# Patient Record
Sex: Male | Born: 1990 | Hispanic: Yes | Marital: Single | Smoking: Never smoker
Health system: Southern US, Community
[De-identification: ages and names within clinical notes are randomized; demographics above are authoritative.]

## PROBLEM LIST (undated history)

## (undated) DIAGNOSIS — K219 Gastro-esophageal reflux disease without esophagitis: Secondary | ICD-10-CM

## (undated) DIAGNOSIS — Z9111 Patient's noncompliance with dietary regimen: Secondary | ICD-10-CM

## (undated) DIAGNOSIS — R112 Nausea with vomiting, unspecified: Secondary | ICD-10-CM

## (undated) DIAGNOSIS — F1290 Cannabis use, unspecified, uncomplicated: Secondary | ICD-10-CM

## (undated) DIAGNOSIS — K279 Peptic ulcer, site unspecified, unspecified as acute or chronic, without hemorrhage or perforation: Secondary | ICD-10-CM

## (undated) DIAGNOSIS — R1115 Cyclical vomiting syndrome unrelated to migraine: Secondary | ICD-10-CM

## (undated) DIAGNOSIS — K3184 Gastroparesis: Secondary | ICD-10-CM

## (undated) DIAGNOSIS — G894 Chronic pain syndrome: Secondary | ICD-10-CM

## (undated) DIAGNOSIS — F12988 Cannabis use, unspecified with other cannabis-induced disorder: Secondary | ICD-10-CM

## (undated) DIAGNOSIS — Z91199 Patient's noncompliance with other medical treatment and regimen due to unspecified reason: Secondary | ICD-10-CM

## (undated) DIAGNOSIS — K449 Diaphragmatic hernia without obstruction or gangrene: Secondary | ICD-10-CM

## (undated) DIAGNOSIS — F419 Anxiety disorder, unspecified: Secondary | ICD-10-CM

## (undated) DIAGNOSIS — Z765 Malingerer [conscious simulation]: Secondary | ICD-10-CM

## (undated) HISTORY — PX: APPENDECTOMY: SHX54

---
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
## 2014-12-04 ENCOUNTER — Emergency Department (HOSPITAL_BASED_OUTPATIENT_CLINIC_OR_DEPARTMENT_OTHER)
Admission: EM | Admit: 2014-12-04 | Discharge: 2014-12-04 | Disposition: A | Discharge status: Home / Self Care | Payer: Self-pay | Attending: Emergency Medicine | Admitting: Emergency Medicine

## 2014-12-04 ENCOUNTER — Encounter (HOSPITAL_BASED_OUTPATIENT_CLINIC_OR_DEPARTMENT_OTHER): Payer: Self-pay

## 2014-12-04 DIAGNOSIS — Z87891 Personal history of nicotine dependence: Secondary | ICD-10-CM | POA: Insufficient documentation

## 2014-12-04 DIAGNOSIS — Z8719 Personal history of other diseases of the digestive system: Secondary | ICD-10-CM | POA: Insufficient documentation

## 2014-12-04 DIAGNOSIS — R1013 Epigastric pain: Secondary | ICD-10-CM | POA: Insufficient documentation

## 2014-12-04 DIAGNOSIS — R112 Nausea with vomiting, unspecified: Secondary | ICD-10-CM | POA: Insufficient documentation

## 2014-12-04 DIAGNOSIS — Z9049 Acquired absence of other specified parts of digestive tract: Secondary | ICD-10-CM | POA: Insufficient documentation

## 2014-12-04 HISTORY — DX: Gastroparesis: K31.84

## 2014-12-04 LAB — COMPREHENSIVE METABOLIC PANEL
ALK PHOS: 101 U/L (ref 38–126)
ALT: 25 U/L (ref 17–63)
AST: 21 U/L (ref 15–41)
Albumin: 4.7 g/dL (ref 3.5–5.0)
Anion gap: 10 (ref 5–15)
BUN: 12 mg/dL (ref 6–20)
CHLORIDE: 99 mmol/L — AB (ref 101–111)
CO2: 26 mmol/L (ref 22–32)
CREATININE: 0.86 mg/dL (ref 0.61–1.24)
Calcium: 9.2 mg/dL (ref 8.9–10.3)
GFR calc Af Amer: >60 mL/min (ref >60)
GFR calc non Af Amer: >60 mL/min (ref >60)
Glucose, Bld: 104 mg/dL — ABNORMAL HIGH (ref 65–99)
POTASSIUM: 4.1 mmol/L (ref 3.5–5.1)
Sodium: 135 mmol/L (ref 135–145)
TOTAL PROTEIN: 7.7 g/dL (ref 6.5–8.1)
Total Bilirubin: 1 mg/dL (ref 0.3–1.2)

## 2014-12-04 LAB — CBC WITH DIFFERENTIAL/PLATELET
BASOS ABS: 0 10*3/uL (ref 0.0–0.1)
Basophils Relative: 0 % (ref 0–1)
EOS PCT: 0 % (ref 0–5)
Eosinophils Absolute: 0 10*3/uL (ref 0.0–0.7)
HCT: 48.2 % (ref 39.0–52.0)
HEMOGLOBIN: 16.7 g/dL (ref 13.0–17.0)
LYMPHS ABS: 2 10*3/uL (ref 0.7–4.0)
Lymphocytes Relative: 18 % (ref 12–46)
MCH: 30.1 pg (ref 26.0–34.0)
MCHC: 34.6 g/dL (ref 30.0–36.0)
MCV: 86.8 fL (ref 78.0–100.0)
MONO ABS: 0.6 10*3/uL (ref 0.1–1.0)
MONOS PCT: 6 % (ref 3–12)
Neutro Abs: 8.4 10*3/uL — ABNORMAL HIGH (ref 1.7–7.7)
Neutrophils Relative %: 76 % (ref 43–77)
PLATELETS: 172 10*3/uL (ref 150–400)
RBC: 5.55 MIL/uL (ref 4.22–5.81)
RDW: 12.9 % (ref 11.5–15.5)
WBC: 11.1 10*3/uL — ABNORMAL HIGH (ref 4.0–10.5)

## 2014-12-04 LAB — URINE MICROSCOPIC-ADD ON

## 2014-12-04 LAB — URINALYSIS, ROUTINE W REFLEX MICROSCOPIC
Bilirubin Urine: NEGATIVE
Glucose, UA: NEGATIVE mg/dL
Hgb urine dipstick: NEGATIVE
KETONES UR: 15 mg/dL — AB
LEUKOCYTES UA: NEGATIVE
Nitrite: NEGATIVE
Protein, ur: NEGATIVE mg/dL
Specific Gravity, Urine: 1.019 (ref 1.005–1.030)
UROBILINOGEN UA: 1 mg/dL (ref 0.0–1.0)
pH: 7.5 (ref 5.0–8.0)

## 2014-12-04 LAB — LIPASE, BLOOD: Lipase: 39 U/L (ref 22–51)

## 2014-12-04 MED ORDER — ONDANSETRON HCL 4 MG PO TABS: 4.0000 mg | ORAL_TABLET | Freq: Three times a day (TID) | ORAL | Status: DC | PRN

## 2014-12-04 MED ORDER — CYCLOBENZAPRINE HCL 10 MG PO TABS: 10.0000 mg | ORAL_TABLET | Freq: Two times a day (BID) | ORAL | Status: DC | PRN

## 2014-12-04 MED ORDER — OXYCODONE-ACETAMINOPHEN 5-325 MG PO TABS
1.0000 | ORAL_TABLET | Freq: Once | ORAL | Status: AC
  Administered 2014-12-04: 1 via ORAL
  Filled 2014-12-04: qty 1

## 2014-12-04 MED ORDER — METOCLOPRAMIDE HCL 10 MG PO TABS
10.0000 mg | ORAL_TABLET | Freq: Four times a day (QID) | ORAL | Status: DC
Start: 2014-12-04 — End: 2017-01-10

## 2014-12-04 MED ORDER — ONDANSETRON 4 MG PO TBDP
4.0000 mg | ORAL_TABLET | Freq: Once | ORAL | Status: DC
  Filled 2014-12-04: qty 1

## 2014-12-04 NOTE — ED Notes (Signed)
Pt reports hx of gastroparesis and states that he "usually gets the same meds", reports diffuse abd pain and vomiting.  States gets phenergan, dilaudid and zofran.

## 2014-12-04 NOTE — Discharge Instructions (Signed)
Please follow with your gastroenterologist as soon as possible, do not hesitate to return to the emergency room for any new, worsening or concerning symptoms. Cyclic Vomiting Syndrome Cyclic vomiting syndrome is a benign condition in which patients experience bouts or cycles of severe nausea and vomiting that last for hours or even days. The bouts of nausea and vomiting alternate with longer periods of no symptoms and generally good health. Cyclic vomiting syndrome occurs mostly in children, but can affect adults. CAUSES  [facility] has no known cause. Each episode is typically similar to the previous ones. The episodes tend to:   Start at about the same time of day.  Last the same length of time.  Present the same symptoms at the same level of intensity. Cyclic vomiting syndrome can begin at any age in children and adults. Cyclic vomiting syndrome usually starts between the ages of 3 and 7 years. In adults, episodes tend to occur less often than they do in children, but they last longer. Furthermore, the events or situations that trigger episodes in adults cannot always be pinpointed as easily as they can in children. There are 4 phases of cyclic vomiting syndrome: 1. Prodrome. The prodrome phase signals that an episode of nausea and vomiting is about to begin. This phase can last from just a few minutes to several hours. This phase is often marked by belly (abdominal) pain. Sometimes taking medicine early in the prodrome phase can stop an episode in progress. However, sometimes there is no warning. A person may simply wake up in the middle of the night or early morning and begin vomiting. 2. Episode. The episode phase consists of:  Severe vomiting.  Nausea.  Gagging (retching). 3. Recovery. The recovery phase begins when the nausea and vomiting stop. Healthy color, appetite, and energy return. 4. Symptom-free interval. The symptom-free interval phase is the period between episodes when no symptoms  are present. TRIGGERS Episodes can be triggered by an infection or event. Examples of triggers include:  Infections.  Colds, allergies, sinus problems, and the flu.  Eating certain foods such as chocolate or cheese.  Foods with monosodium glutamate (MSG) or preservatives.  Fast foods.  Pre-packaged foods.  Foods with low nutritional value (junk foods).  Overeating.  Eating just before going to bed.  Hot weather.  Dehydration.  Not enough sleep or poor sleep quality.  Physical exhaustion.  Menstruation.  Motion sickness.  Emotional stress (school or home difficulties).  Excitement or stress. SYMPTOMS  The main symptoms of cyclic vomiting syndrome are:  Severe vomiting.  Nausea.  Gagging (retching). Episodes usually begin at night or the first thing in the morning. Episodes may include vomiting or retching up to 5 or 6 times an hour during the worst of the episode. Episodes usually last anywhere from 1 to 4 days. Episodes can last for up to 10 days. Other symptoms include:  Paleness.  Exhaustion.  Listlessness.  Abdominal pain.  Loose stools or diarrhea. Sometimes the nausea and vomiting are so severe that a person appears to be almost unconscious. Sensitivity to light, headache, fever, dizziness, may also accompany an episode. In addition, the vomiting may cause drooling and excessive thirst. Drinking water usually leads to more vomiting, though the water can dilute the acid in the vomit, making the episode a little less painful. Continuous vomiting can lead to dehydration, which means that the body has lost excessive water and salts. DIAGNOSIS  Cyclic vomiting syndrome is hard to diagnose because there are no clear  tests to identify it. A caregiver must diagnose cyclic vomiting syndrome by looking at symptoms and medical history. A caregiver must exclude more common diseases or disorders that can also cause nausea and vomiting. Also, diagnosis takes time  because caregivers need to identify a pattern or cycle to the vomiting. TREATMENT  Cyclic vomiting syndrome cannot be cured. Treatment varies, but people with cyclic vomiting syndrome should get plenty of rest and sleep and take medications that prevent, stop, or lessen the vomiting episodes and other symptoms. People whose episodes are frequent and long-lasting may be treated during the symptom-free intervals in an effort to prevent or ease future episodes. The symptom-free phase is a good time to eliminate anything known to trigger an episode. For example, if episodes are brought on by stress or excitement, this period is the time to find ways to reduce stress and stay calm. If sinus problems or allergies cause episodes, those conditions should be treated. The triggers listed above should be avoided or prevented. Because of the similarities between migraine and cyclic vomiting syndrome, caregivers treat some people with severe cyclic vomiting syndrome with drugs that are also used for migraine headaches. The drugs are designed to:  Prevent episodes.  Reduce their frequency.  Lessen their severity. HOME CARE INSTRUCTIONS Once a vomiting episode begins, treatment is supportive. It helps to stay in bed and sleep in a dark, quiet room. Severe nausea and vomiting may require hospitalization and intravenous (IV) fluids to prevent dehydration. Relaxing medications (sedatives) may help if the nausea continues. Sometimes, during the prodrome phase, it is possible to stop an episode from happening altogether. Only take over-the-counter or prescription medicines for pain, discomfort or fever as directed by your caregiver. Do not give aspirin to children. During the recovery phase, drinking water and replacing lost electrolytes (salts in the blood) are very important. Electrolytes are salts that the body needs to function well and stay healthy. Symptoms during the recovery phase can vary. Some people find that  their appetites return to normal immediately, while others need to begin by drinking clear liquids and then move slowly to solid food. RELATED COMPLICATIONS The severe vomiting that defines cyclic vomiting syndrome is a risk factor for several complications:  Dehydration--Vomiting causes the body to lose water quickly.  Electrolyte imbalance--Vomiting also causes the body to lose the important salts it needs to keep working properly.  Peptic esophagitis--The tube that connects the mouth to the stomach (esophagus) becomes injured from the stomach acid that comes up with the vomit.  Hematemesis--The esophagus becomes irritated and bleeds, so blood mixes with the vomit.  Mallory-Weiss tear--The lower end of the esophagus may tear open or the stomach may bruise from vomiting or retching.  Tooth decay--The acid in the vomit can hurt the teeth by corroding the tooth enamel. SEEK MEDICAL CARE IF: You have questions or problems. Document Released: 09/10/2001 Document Revised: 09/24/2011 Document Reviewed: 10/09/2010 [facility] Patient Information 2015 [place], [place]. This information is not intended to replace advice given to you by your health care provider. Make sure you discuss any questions you have with your health care provider.

## 2014-12-04 NOTE — ED Provider Notes (Signed)
CSN: [id number]     Arrival date & time 12/04/14  1344 History   First MD Initiated Contact with Patient 12/04/14 1506     Chief Complaint  Patient presents with  . Emesis     (Consider location/radiation/quality/duration/timing/severity/associated sxs/prior Treatment) HPI   Tom Munoz is a 24 y.o. male complaining of ear epigastric abdominal pain associated with 3 episodes of nonbloody, nonbilious, no coffee-ground emesis starting this morning. Patient says he has a history of gastroparesis (nondiabetic) states he was advised by his gastroenterologist that he needed Dilaudid, Phenergan and Zofran. Patient states that [facility] and was frustrated, left because they would not give him the medication that he had requested. States nausea has improved.   Past Medical History  Diagnosis Date  . Gastroparesis    Past Surgical History  Procedure Laterality Date  . Appendectomy     No family history on file. History  Substance Use Topics  . Smoking status: Former Games developermoker  . Smokeless tobacco: Not on file  . Alcohol Use: No    Review of Systems  10 systems reviewed and found to be negative, except as noted in the HPI.   Allergies  Review of patient's allergies indicates no known allergies.  Home Medications   Prior to Admission medications   Medication Sig Start Date End Date Taking? Authorizing Provider  LORazepam (ATIVAN) 0.5 MG tablet Take 0.5 mg by mouth every 8 (eight) hours.   Yes Historical Provider, MD  ondansetron (ZOFRAN-ODT) 4 MG disintegrating tablet Take 4 mg by mouth every 8 (eight) hours as needed for nausea or vomiting.   Yes Historical Provider, MD   BP 174/97 mmHg  Pulse 102  Temp(Src) 97.8 F (36.6 C) (Oral)  Resp 18  Ht 5\' 7"  (1.702 m)  Wt 175 lb (79.379 kg)  BMI 27.40 kg/m2  SpO2 100% Physical Exam  Constitutional: He is oriented to person, place, and time. He appears well-developed and well-nourished. No distress.   HENT:  Head: Normocephalic and atraumatic.  Mouth/Throat: Oropharynx is clear and moist.  Eyes: Conjunctivae and EOM are normal. Pupils are equal, round, and reactive to light.  Neck: Normal range of motion.  Cardiovascular: Normal rate, regular rhythm and intact distal pulses.   Pulmonary/Chest: Effort normal and breath sounds normal.  Abdominal: Soft. There is no tenderness.  Mild, diffuse tenderness to palpation with no guarding or rebound.  Murphy sign negative, no tenderness to palpation over McBurney's point, Rovsings, Psoas and obturator all negative.    Musculoskeletal: Normal range of motion.  Neurological: He is alert and oriented to person, place, and time.  Skin: He is not diaphoretic.  Psychiatric: He has a normal mood and affect.  Nursing note and vitals reviewed.   ED Course  Procedures (including critical care time) Labs Review Labs Reviewed  URINALYSIS, ROUTINE W REFLEX MICROSCOPIC - Abnormal; Notable for the following:    APPearance TURBID (*)    Ketones, ur 15 (*)    All other components within normal limits  URINE MICROSCOPIC-ADD ON - Abnormal; Notable for the following:    Bacteria, UA MANY (*)    All other components within normal limits  CBC WITH DIFFERENTIAL/PLATELET - Abnormal; Notable for the following:    WBC 11.1 (*)    Neutro Abs 8.4 (*)    All other components within normal limits  COMPREHENSIVE METABOLIC PANEL - Abnormal; Notable for the following:    Chloride 99 (*)    Glucose, Bld 104 (*)  All other components within normal limits  LIPASE, BLOOD    Imaging Review No results found.   EKG Interpretation None      MDM   Final diagnoses:  Non-intractable vomiting with nausea, vomiting of unspecified type    Filed Vitals:   12/04/14 1405 12/04/14 1608 12/04/14 1719  BP: 174/97 158/87 152/78  Pulse: 102 89 78  Temp: 97.8 F (36.6 C)    TempSrc: Oral    Resp: Height:  (1.702 m)    Weight: 175 lb (79.379 kg)     SpO2: 100% 100%     Medications  ondansetron (ZOFRAN-ODT) disintegrating tablet 4 mg (4 mg Oral Not Given 12/04/14 1629)  oxyCODONE-acetaminophen (PERCOCET/ROXICET) 5-325 MG per tablet 1 tablet (1 tablet Oral Given 12/04/14 1628)    Tom Munoz is a pleasant 24 y.o. male presenting with several episodes of emesis this morning associated with severe epigastric abdominal pain. States this is consistent with prior episodes of gastroparesis. Blood work with no significant abnormality, UA with 15 ketones. Abdominal exam is benign and patient states his nausea has resolved and he is tolerating by mouth, he is specifically requesting Dilaudid. I explained to him that there is no indication for Dilaudid as he is tolerating by mouth. Patient is displeased with this, I will give him Zofran and by mouth challenge with Percocet. Patient is tolerated the Percocet without issue, he has declined the Zofran. Patient is stated to the nurse that he could buy whatever narcotics he wants off the street and that he wants Dilaudid. Again I have declined the Dilaudid he will have to follow-up with his gastroenterologist. Real abdominal exams remain benign.  Evaluation does not show pathology that would require ongoing emergent intervention or inpatient treatment. Pt is hemodynamically stable and mentating appropriately. Discussed findings and plan with patient/guardian, who agrees with care plan. All questions answered. Return precautions discussed and outpatient follow up given.   Discharge Medication List as of 12/04/2014  5:04 PM    START taking these medications   Details  metoCLOPramide (REGLAN) 10 MG tablet Take 1 tablet (10 mg total) by mouth 4 (four) times daily., Starting 12/04/2014, Until Discontinued, Print             Wynetta Emery, PA-C 12/04/14 1737  Arby Barrette, MD 12/07/14 1436

## 2014-12-04 NOTE — ED Notes (Signed)
Patient refused zofran and stated that he had that medicine at home. Requested dilaudid and stated that other facilities would give him narcotics. He also stated that he could buy narcotics off the street if needed & that he would take the oxycodone but wanted something stronger.

## 2015-08-11 ENCOUNTER — Encounter (HOSPITAL_COMMUNITY): Payer: Self-pay | Admitting: Emergency Medicine

## 2015-08-11 ENCOUNTER — Emergency Department (HOSPITAL_COMMUNITY)
Admission: EM | Admit: 2015-08-11 | Discharge: 2015-08-12 | Discharge status: Against Medical Advice | Payer: Self-pay | Attending: Emergency Medicine | Admitting: Emergency Medicine

## 2015-08-11 DIAGNOSIS — Y998 Other external cause status: Secondary | ICD-10-CM | POA: Insufficient documentation

## 2015-08-11 DIAGNOSIS — S3992XA Unspecified injury of lower back, initial encounter: Secondary | ICD-10-CM | POA: Insufficient documentation

## 2015-08-11 DIAGNOSIS — Y92410 Unspecified street and highway as the place of occurrence of the external cause: Secondary | ICD-10-CM | POA: Insufficient documentation

## 2015-08-11 DIAGNOSIS — Y9389 Activity, other specified: Secondary | ICD-10-CM | POA: Insufficient documentation

## 2015-08-11 LAB — CBC WITH DIFFERENTIAL/PLATELET
BASOS ABS: 0 10*3/uL (ref 0.0–0.1)
BASOS PCT: 0 %
EOS PCT: 0 %
Eosinophils Absolute: 0 10*3/uL (ref 0.0–0.7)
HCT: 50.4 % (ref 39.0–52.0)
Hemoglobin: 17.5 g/dL — ABNORMAL HIGH (ref 13.0–17.0)
LYMPHS PCT: 15 %
Lymphs Abs: 1.6 10*3/uL (ref 0.7–4.0)
MCH: 30.9 pg (ref 26.0–34.0)
MCHC: 34.7 g/dL (ref 30.0–36.0)
MCV: 89 fL (ref 78.0–100.0)
Monocytes Absolute: 0.5 10*3/uL (ref 0.1–1.0)
Monocytes Relative: 5 %
Neutro Abs: 8.3 10*3/uL — ABNORMAL HIGH (ref 1.7–7.7)
Neutrophils Relative %: 80 %
PLATELETS: 216 10*3/uL (ref 150–400)
RBC: 5.66 MIL/uL (ref 4.22–5.81)
RDW: 13.7 % (ref 11.5–15.5)
WBC: 10.4 10*3/uL (ref 4.0–10.5)

## 2015-08-11 LAB — COMPREHENSIVE METABOLIC PANEL
ALBUMIN: 4.7 g/dL (ref 3.5–5.0)
ALT: 18 U/L (ref 17–63)
AST: 24 U/L (ref 15–41)
Alkaline Phosphatase: 91 U/L (ref 38–126)
Anion gap: 14 (ref 5–15)
BUN: 13 mg/dL (ref 6–20)
CO2: 24 mmol/L (ref 22–32)
CREATININE: 1.06 mg/dL (ref 0.61–1.24)
Calcium: 10 mg/dL (ref 8.9–10.3)
Chloride: 104 mmol/L (ref 101–111)
GFR calc Af Amer: >60 mL/min (ref >60)
GFR calc non Af Amer: >60 mL/min (ref >60)
GLUCOSE: 153 mg/dL — AB (ref 65–99)
POTASSIUM: 3.6 mmol/L (ref 3.5–5.1)
Sodium: 142 mmol/L (ref 135–145)
Total Bilirubin: 1.2 mg/dL (ref 0.3–1.2)
Total Protein: 7.8 g/dL (ref 6.5–8.1)

## 2015-08-11 MED ORDER — ONDANSETRON 4 MG PO TBDP
ORAL_TABLET | ORAL | Status: DC
Start: 2015-08-11 — End: 2015-08-12
  Filled 2015-08-11: qty 1

## 2015-08-11 MED ORDER — ONDANSETRON 4 MG PO TBDP
4.0000 mg | ORAL_TABLET | Freq: Once | ORAL | Status: AC
  Administered 2015-08-11: 4 mg via ORAL

## 2015-08-11 MED ORDER — ONDANSETRON 4 MG PO TBDP
ORAL_TABLET | ORAL | Status: AC
  Filled 2015-08-11: qty 1
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
## 2015-08-12 NOTE — ED Notes (Signed)
Pt not present in waiting area.

## 2015-08-12 NOTE — ED Notes (Signed)
Called out for patient to go back to a room with no response. Will re-attempt when next room becomes available

## 2015-08-30 ENCOUNTER — Encounter (HOSPITAL_BASED_OUTPATIENT_CLINIC_OR_DEPARTMENT_OTHER): Payer: Self-pay

## 2015-08-30 ENCOUNTER — Emergency Department (HOSPITAL_BASED_OUTPATIENT_CLINIC_OR_DEPARTMENT_OTHER)
Admission: EM | Admit: 2015-08-30 | Discharge: 2015-08-30 | Discharge status: Against Medical Advice | Payer: Self-pay | Attending: Emergency Medicine | Admitting: Emergency Medicine

## 2015-08-30 DIAGNOSIS — Z87891 Personal history of nicotine dependence: Secondary | ICD-10-CM | POA: Insufficient documentation

## 2015-08-30 DIAGNOSIS — Z7289 Other problems related to lifestyle: Secondary | ICD-10-CM | POA: Insufficient documentation

## 2015-08-30 DIAGNOSIS — Z765 Malingerer [conscious simulation]: Secondary | ICD-10-CM

## 2015-08-30 DIAGNOSIS — Z8669 Personal history of other diseases of the nervous system and sense organs: Secondary | ICD-10-CM | POA: Insufficient documentation

## 2015-08-30 DIAGNOSIS — R112 Nausea with vomiting, unspecified: Secondary | ICD-10-CM | POA: Insufficient documentation

## 2015-08-30 DIAGNOSIS — Z8719 Personal history of other diseases of the digestive system: Secondary | ICD-10-CM | POA: Insufficient documentation

## 2015-08-30 DIAGNOSIS — R1084 Generalized abdominal pain: Secondary | ICD-10-CM | POA: Insufficient documentation

## 2015-08-30 DIAGNOSIS — Z8659 Personal history of other mental and behavioral disorders: Secondary | ICD-10-CM | POA: Insufficient documentation

## 2015-08-30 DIAGNOSIS — Z87898 Personal history of other specified conditions: Secondary | ICD-10-CM

## 2015-08-30 HISTORY — DX: Chronic pain syndrome: G89.4

## 2015-08-30 HISTORY — DX: Cannabis use, unspecified with other cannabis-induced disorder: F12.988

## 2015-08-30 HISTORY — DX: Gastro-esophageal reflux disease without esophagitis: K21.9

## 2015-08-30 HISTORY — DX: Cannabis use, unspecified, uncomplicated: F12.90

## 2015-08-30 HISTORY — DX: Cyclical vomiting syndrome unrelated to migraine: R11.15

## 2015-08-30 HISTORY — DX: Nausea with vomiting, unspecified: R11.2

## 2015-08-30 LAB — CBC WITH DIFFERENTIAL/PLATELET
BASOS ABS: 0 10*3/uL (ref 0.0–0.1)
BASOS PCT: 0 %
EOS ABS: 0 10*3/uL (ref 0.0–0.7)
Eosinophils Relative: 0 %
HCT: 51.7 % (ref 39.0–52.0)
Hemoglobin: 17.7 g/dL — ABNORMAL HIGH (ref 13.0–17.0)
Lymphocytes Relative: 6 %
Lymphs Abs: 0.7 10*3/uL (ref 0.7–4.0)
MCH: 29.4 pg (ref 26.0–34.0)
MCHC: 34.2 g/dL (ref 30.0–36.0)
MCV: 85.7 fL (ref 78.0–100.0)
MONO ABS: 0.2 10*3/uL (ref 0.1–1.0)
Monocytes Relative: 2 %
Neutro Abs: 10.5 10*3/uL — ABNORMAL HIGH (ref 1.7–7.7)
Neutrophils Relative %: 92 %
Platelets: 226 10*3/uL (ref 150–400)
RBC: 6.03 MIL/uL — ABNORMAL HIGH (ref 4.22–5.81)
RDW: 13.5 % (ref 11.5–15.5)
WBC: 11.4 10*3/uL — ABNORMAL HIGH (ref 4.0–10.5)

## 2015-08-30 LAB — COMPREHENSIVE METABOLIC PANEL
ALBUMIN: 5.3 g/dL — AB (ref 3.5–5.0)
ALK PHOS: 116 U/L (ref 38–126)
ALT: 20 U/L (ref 17–63)
ANION GAP: 13 (ref 5–15)
AST: 24 U/L (ref 15–41)
BUN: 16 mg/dL (ref 6–20)
CALCIUM: 9.8 mg/dL (ref 8.9–10.3)
CO2: 26 mmol/L (ref 22–32)
Chloride: 103 mmol/L (ref 101–111)
Creatinine, Ser: 0.96 mg/dL (ref 0.61–1.24)
GFR calc Af Amer: >60 mL/min (ref >60)
GFR calc non Af Amer: >60 mL/min (ref >60)
GLUCOSE: 137 mg/dL — AB (ref 65–99)
Potassium: 4.1 mmol/L (ref 3.5–5.1)
Sodium: 142 mmol/L (ref 135–145)
Total Bilirubin: 1 mg/dL (ref 0.3–1.2)
Total Protein: 8.7 g/dL — ABNORMAL HIGH (ref 6.5–8.1)

## 2015-08-30 LAB — LIPASE, BLOOD: LIPASE: 23 U/L (ref 11–51)

## 2015-08-30 MED ORDER — SODIUM CHLORIDE 0.9 % IV BOLUS (SEPSIS)
2000.0000 mL | Freq: Once | INTRAVENOUS | Status: AC
  Administered 2015-08-30: 2000 mL via INTRAVENOUS

## 2015-08-30 MED ORDER — PROMETHAZINE HCL 25 MG/ML IJ SOLN
25.0000 mg | Freq: Once | INTRAMUSCULAR | Status: AC
  Administered 2015-08-30: 25 mg via INTRAVENOUS
  Filled 2015-08-30: qty 1
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
## 2015-08-30 NOTE — ED Notes (Addendum)
Pt received and when nurse went to hang second bag of fluid and ask for urine sample, pt states he "just wants to go.  If I'm not going to get anything for pain, then I want to go."  Pt advised that he should stay for all of his IVF as that was understood as the reason he was here.  Pt continues to want to leave since he is not going to receive medication for his chronic pain in the ED and is again referred to the policy on the wall of his room.  Pt understands that he is leaving AMA and verbalizes knowledge of the risks of doing so.  Informed Dr. Read Drivers of pt's plan.

## 2015-08-30 NOTE — ED Notes (Signed)
Pt states he still cannot provide urine sample.  Pt informed that nurse would return after his first liter of IVF is finished, in approximately more, and pt can try again.  No episodes of visible emesis since arrival to ED.
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
## 2015-08-30 NOTE — ED Notes (Signed)
Entered room to get urine sample and pt is sleeping.  Woke him to try to use urinal.

## 2016-05-22 ENCOUNTER — Encounter (HOSPITAL_COMMUNITY): Payer: Self-pay | Admitting: Emergency Medicine

## 2017-01-09 ENCOUNTER — Emergency Department (HOSPITAL_BASED_OUTPATIENT_CLINIC_OR_DEPARTMENT_OTHER)
Admission: EM | Admit: 2017-01-09 | Discharge: 2017-01-10 | Disposition: A | Discharge status: Home / Self Care | Payer: PRIVATE HEALTH INSURANCE | Attending: Emergency Medicine | Admitting: Emergency Medicine

## 2017-01-09 ENCOUNTER — Encounter (HOSPITAL_BASED_OUTPATIENT_CLINIC_OR_DEPARTMENT_OTHER): Payer: Self-pay | Admitting: *Deleted

## 2017-01-09 DIAGNOSIS — Z87891 Personal history of nicotine dependence: Secondary | ICD-10-CM | POA: Insufficient documentation

## 2017-01-09 DIAGNOSIS — Z79899 Other long term (current) drug therapy: Secondary | ICD-10-CM | POA: Insufficient documentation

## 2017-01-09 DIAGNOSIS — F12188 Cannabis abuse with other cannabis-induced disorder: Secondary | ICD-10-CM

## 2017-01-09 HISTORY — DX: Malingerer (conscious simulation): Z76.5

## 2017-01-09 LAB — RAPID URINE DRUG SCREEN, HOSP PERFORMED
AMPHETAMINES: NOT DETECTED
BARBITURATES: NOT DETECTED
BENZODIAZEPINES: NOT DETECTED
COCAINE: NOT DETECTED
Opiates: NOT DETECTED
Tetrahydrocannabinol: NOT DETECTED

## 2017-01-09 LAB — CBC WITH DIFFERENTIAL/PLATELET
BASOS ABS: 0 10*3/uL (ref 0.0–0.1)
BASOS PCT: 0 %
Eosinophils Absolute: 0 10*3/uL (ref 0.0–0.7)
Eosinophils Relative: 0 %
HEMATOCRIT: 47 % (ref 39.0–52.0)
HEMOGLOBIN: 16.7 g/dL (ref 13.0–17.0)
Lymphocytes Relative: 22 %
Lymphs Abs: 1.9 10*3/uL (ref 0.7–4.0)
MCH: 30.4 pg (ref 26.0–34.0)
MCHC: 35.5 g/dL (ref 30.0–36.0)
MCV: 85.5 fL (ref 78.0–100.0)
MONO ABS: 0.7 10*3/uL (ref 0.1–1.0)
Monocytes Relative: 8 %
NEUTROS ABS: 6.1 10*3/uL (ref 1.7–7.7)
NEUTROS PCT: 70 %
Platelets: 226 10*3/uL (ref 150–400)
RBC: 5.5 MIL/uL (ref 4.22–5.81)
RDW: 14.4 % (ref 11.5–15.5)
WBC: 8.7 10*3/uL (ref 4.0–10.5)

## 2017-01-09 LAB — URINALYSIS, ROUTINE W REFLEX MICROSCOPIC
Bilirubin Urine: NEGATIVE
Glucose, UA: NEGATIVE mg/dL
Hgb urine dipstick: NEGATIVE
KETONES UR: NEGATIVE mg/dL
Leukocytes, UA: NEGATIVE
NITRITE: NEGATIVE
PH: 7 (ref 5.0–8.0)
Protein, ur: NEGATIVE mg/dL
Specific Gravity, Urine: 1.018 (ref 1.005–1.030)

## 2017-01-09 MED ORDER — SODIUM CHLORIDE 0.9 % IV SOLN
Freq: Once | INTRAVENOUS | Status: AC
  Administered 2017-01-09: 1000 mL via INTRAVENOUS

## 2017-01-09 NOTE — ED Notes (Signed)
C/o abd pain w n/v,  Has been seen for same

## 2017-01-09 NOTE — ED Triage Notes (Signed)
Pt c/o abd pain with n/v.  

## 2017-01-10 ENCOUNTER — Encounter (HOSPITAL_BASED_OUTPATIENT_CLINIC_OR_DEPARTMENT_OTHER): Payer: Self-pay | Admitting: Emergency Medicine

## 2017-01-10 LAB — COMPREHENSIVE METABOLIC PANEL
ALT: 20 U/L (ref 17–63)
AST: 21 U/L (ref 15–41)
Albumin: 5 g/dL (ref 3.5–5.0)
Alkaline Phosphatase: 84 U/L (ref 38–126)
Anion gap: 12 (ref 5–15)
BUN: 12 mg/dL (ref 6–20)
CHLORIDE: 101 mmol/L (ref 101–111)
CO2: 25 mmol/L (ref 22–32)
CREATININE: 1.1 mg/dL (ref 0.61–1.24)
Calcium: 9.6 mg/dL (ref 8.9–10.3)
GFR calc Af Amer: >60 mL/min (ref >60)
GFR calc non Af Amer: >60 mL/min (ref >60)
Glucose, Bld: 102 mg/dL — ABNORMAL HIGH (ref 65–99)
Potassium: 3.3 mmol/L — ABNORMAL LOW (ref 3.5–5.1)
Sodium: 138 mmol/L (ref 135–145)
Total Bilirubin: 1.1 mg/dL (ref 0.3–1.2)
Total Protein: 8.4 g/dL — ABNORMAL HIGH (ref 6.5–8.1)

## 2017-01-10 MED ORDER — PROMETHAZINE HCL 25 MG/ML IJ SOLN
25.0000 mg | Freq: Once | INTRAMUSCULAR | Status: AC
  Administered 2017-01-10: 25 mg via INTRAVENOUS
  Filled 2017-01-10: qty 1

## 2017-01-10 MED ORDER — PROMETHAZINE HCL 25 MG PO TABS: 25.0000 mg | ORAL_TABLET | Freq: Four times a day (QID) | ORAL | 0 refills | Status: DC | PRN

## 2017-01-10 MED ORDER — ONDANSETRON HCL 4 MG/2ML IJ SOLN
4.0000 mg | Freq: Once | INTRAMUSCULAR | Status: AC
  Administered 2017-01-10: 4 mg via INTRAVENOUS
  Filled 2017-01-10: qty 2

## 2017-01-10 MED ORDER — CAPSAICIN 0.075 % EX CREA
TOPICAL_CREAM | CUTANEOUS | Status: DC
  Administered 2017-01-10: 1 via TOPICAL
  Filled 2017-01-10: qty 60

## 2017-01-10 NOTE — ED Provider Notes (Signed)
[facility] Provider Note: Tom Munoz Tom Yanes, MD, FACEP  CSN: [id number] MRN: [id number] ARRIVAL: 01/09/17 at 2317 ROOM: MH10/MH10   CHIEF COMPLAINT  Abdominal Pain   HISTORY OF PRESENT ILLNESS  Tom Munoz is a 26 y.o. male with a long-standing history of hyperemesis likely due to cannabis. He has had multiple and frequent visits to emergency departments for this complaint. He is here with 6 days of intractable nausea and vomiting with right upper quadrant pain which he describes as a spasm. He rates his pain as an 8 out of 10. He has gotten some relief from the pain with a heating pad. He has not gotten relief with his home antiemetics. He was seen at [facility] on the 25th of this month and had a workup that included a CT scan and abdominal ultrasound which were negative. His urine tested positive for cannabis at that time.   He checked in to the [facility] again yesterday afternoon but left without being seen. He told triage that his symptoms had been going on for several weeks.  He states [place] planning [place] this [place].   Past Medical History:  Diagnosis Date  . Cannabinoid hyperemesis syndrome (HCC)   . Chronic pain syndrome   . Cyclic vomiting syndrome   . Drug-seeking behavior   . Gastroparesis   . GERD (gastroesophageal reflux disease)     Past Surgical History:  Procedure Laterality Date  . APPENDECTOMY    . APPENDECTOMY  2012    History reviewed. No pertinent family history.  Social History  Substance Use Topics  . Smoking status: Former Games developermoker  . Smokeless tobacco: Never Used  . Alcohol use No    Prior to Admission medications   Medication Sig Start Date End Date Taking? Authorizing Provider  LORazepam (ATIVAN) 0.5 MG tablet Take 0.5 mg by mouth every 8 (eight) hours.    [provider]  metoCLOPramide (REGLAN) 10 MG tablet Take 1 tablet (10 mg total) by mouth 4  (four) times daily. 12/04/14   Pisciotta, Joni ReiningNicole, PA-C  ondansetron (ZOFRAN-ODT) 4 MG disintegrating tablet Take 4 mg by mouth every 8 (eight) hours as needed for nausea or vomiting.    [provider]  promethazine (PHENERGAN) 25 MG suppository Place 25 mg rectally every 6 (six) hours as needed for nausea or vomiting.    [provider]    Allergies Bentyl [dicyclomine] and Reglan [metoclopramide]   REVIEW OF SYSTEMS  Negative except as noted here or in the History of Present Illness.   PHYSICAL EXAMINATION  Initial Vital Signs Blood pressure (!) 153/94, pulse 65, temperature 98.8 F (37.1 C), resp. rate 18, height 5\' 6"  (1.676 m), weight 70.3 kg (155 lb), SpO2 100 %.  Examination General: Well-developed, well-nourished male in no acute distress; appearance consistent with age of record HENT: normocephalic; atraumatic Eyes: pupils equal, round and reactive to light; extraocular muscles intact Neck: supple Heart: regular rate and rhythm Lungs: clear to auscultation bilaterally Abdomen: soft; nondistended; right upper quadrant tenderness; no masses or hepatosplenomegaly; bowel sounds present Extremities: No deformity; full range of motion; pulses normal Neurologic: Awake, alert and oriented; motor function intact in all extremities and symmetric; no facial droop Skin: Warm and dry Psychiatric: Flat affect   RESULTS  Summary of this visit's results, reviewed by myself:   EKG Interpretation  Date/Time:    Ventricular Rate:    PR Interval:    QRS  Duration:   QT Interval:    QTC Calculation:   R Axis:     Text Interpretation:        Laboratory Studies: Results for orders placed or performed during the hospital encounter of 01/09/17 (from the past 24 hour(s))  Urinalysis, Routine w reflex microscopic     Status: None   Collection Time: 01/09/17 11:28 PM  Result Value Ref Range   Color, Urine YELLOW YELLOW   APPearance CLEAR CLEAR   Specific Gravity,  Urine 1.018 1.005 - 1.030   pH 7.0 5.0 - 8.0   Glucose, UA NEGATIVE NEGATIVE mg/dL   Hgb urine dipstick NEGATIVE NEGATIVE   Bilirubin Urine NEGATIVE NEGATIVE   Ketones, ur NEGATIVE NEGATIVE mg/dL   Protein, ur NEGATIVE NEGATIVE mg/dL   Nitrite NEGATIVE NEGATIVE   Leukocytes, UA NEGATIVE NEGATIVE  Rapid urine drug screen (hospital performed)     Status: None   Collection Time: 01/09/17 11:28 PM  Result Value Ref Range   Opiates NONE DETECTED NONE DETECTED   Cocaine NONE DETECTED NONE DETECTED   Benzodiazepines NONE DETECTED NONE DETECTED   Amphetamines NONE DETECTED NONE DETECTED   Tetrahydrocannabinol NONE DETECTED NONE DETECTED   Barbiturates NONE DETECTED NONE DETECTED  CBC with Differential     Status: None   Collection Time: 01/09/17 11:41 PM  Result Value Ref Range   WBC 8.7 4.0 - 10.5 K/uL   RBC 5.50 4.22 - 5.81 MIL/uL   Hemoglobin 16.7 13.0 - 17.0 g/dL   HCT 40.9 81.1 - 91.4 %   MCV 85.5 78.0 - 100.0 fL   MCH 30.4 26.0 - 34.0 pg   MCHC 35.5 30.0 - 36.0 g/dL   RDW 78.2 95.6 - 21.3 %   Platelets 226 150 - 400 K/uL   Neutrophils Relative % 70 %   Neutro Abs 6.1 1.7 - 7.7 K/uL   Lymphocytes Relative 22 %   Lymphs Abs 1.9 0.7 - 4.0 K/uL   Monocytes Relative 8 %   Monocytes Absolute 0.7 0.1 - 1.0 K/uL   Eosinophils Relative 0 %   Eosinophils Absolute 0.0 0.0 - 0.7 K/uL   Basophils Relative 0 %   Basophils Absolute 0.0 0.0 - 0.1 K/uL  Comprehensive metabolic panel     Status: Abnormal   Collection Time: 01/09/17 11:41 PM  Result Value Ref Range   Sodium 138 135 - 145 mmol/L   Potassium 3.3 (L) 3.5 - 5.1 mmol/L   Chloride 101 101 - 111 mmol/L   CO2 25 22 - 32 mmol/L   Glucose, Bld 102 (H) 65 - 99 mg/dL   BUN 12 6 - 20 mg/dL   Creatinine, Ser 0.86 0.61 - 1.24 mg/dL   Calcium 9.6 8.9 - 57.8 mg/dL   Total Protein 8.4 (H) 6.5 - 8.1 g/dL   Albumin 5.0 3.5 - 5.0 g/dL   AST 21 15 - 41 U/L   ALT 20 17 - 63 U/L   Alkaline Phosphatase 84 38 - 126 U/L   Total Bilirubin 1.1  0.3 - 1.2 mg/dL   GFR calc non Af Amer >60 >60 mL/min   GFR calc Af Amer >60 >60 mL/min   Anion gap 12 5 - 15   Imaging Studies: No results found.  ED COURSE  Nursing notes and initial vitals signs, including pulse oximetry, reviewed.  Vitals:   01/09/17 2323 01/10/17 0234  BP: (!) 153/94 (!) 150/100  Pulse: 65 97  Resp: 18 18  Temp: 98.8 F (37.1 C)  SpO2: 100% 100%  Weight: 70.3 kg (155 lb)   Height: 5\' 6"  (1.676 m)    3:22 AM The patient alleges he vomited 5 minutes ago after his second dose of Phenergan but there is no additional emesis in his emesis bag.  3:58 AM Patient states he is ready to go home now after being given Zofran. He is requesting a prescription for Phenergan tablets.  PROCEDURES    ED DIAGNOSES     ICD-10-CM   1. Cannabis hyperemesis syndrome concurrent with and due to cannabis abuse (HCC) F12.188        Paula Libra, MD 01/10/17 [phone]

## 2017-07-22 ENCOUNTER — Emergency Department (HOSPITAL_COMMUNITY)
Admission: EM | Admit: 2017-07-22 | Discharge: 2017-07-22 | Disposition: A | Discharge status: Home / Self Care | Payer: PRIVATE HEALTH INSURANCE | Attending: Emergency Medicine | Admitting: Emergency Medicine

## 2017-07-22 ENCOUNTER — Encounter (HOSPITAL_COMMUNITY): Payer: Self-pay | Admitting: Emergency Medicine

## 2017-07-22 DIAGNOSIS — R1084 Generalized abdominal pain: Secondary | ICD-10-CM | POA: Insufficient documentation

## 2017-07-22 DIAGNOSIS — Z5321 Procedure and treatment not carried out due to patient leaving prior to being seen by health care provider: Secondary | ICD-10-CM | POA: Insufficient documentation

## 2017-07-22 LAB — COMPREHENSIVE METABOLIC PANEL
ALT: 24 U/L (ref 17–63)
AST: 22 U/L (ref 15–41)
Albumin: 5 g/dL (ref 3.5–5.0)
Alkaline Phosphatase: 108 U/L (ref 38–126)
Anion gap: 11 (ref 5–15)
BUN: 17 mg/dL (ref 6–20)
CHLORIDE: 103 mmol/L (ref 101–111)
CO2: 25 mmol/L (ref 22–32)
Calcium: 9.7 mg/dL (ref 8.9–10.3)
Creatinine, Ser: 1.02 mg/dL (ref 0.61–1.24)
Glucose, Bld: 109 mg/dL — ABNORMAL HIGH (ref 65–99)
POTASSIUM: 3.2 mmol/L — AB (ref 3.5–5.1)
Sodium: 139 mmol/L (ref 135–145)
Total Bilirubin: 0.9 mg/dL (ref 0.3–1.2)
Total Protein: 8.4 g/dL — ABNORMAL HIGH (ref 6.5–8.1)

## 2017-07-22 LAB — CBC
HEMATOCRIT: 49.9 % (ref 39.0–52.0)
Hemoglobin: 17.9 g/dL — ABNORMAL HIGH (ref 13.0–17.0)
MCH: 31.6 pg (ref 26.0–34.0)
MCHC: 35.9 g/dL (ref 30.0–36.0)
MCV: 88 fL (ref 78.0–100.0)
Platelets: 207 10*3/uL (ref 150–400)
RBC: 5.67 MIL/uL (ref 4.22–5.81)
RDW: 13.4 % (ref 11.5–15.5)
WBC: 17.2 10*3/uL — AB (ref 4.0–10.5)

## 2017-07-22 LAB — LIPASE, BLOOD: LIPASE: 21 U/L (ref 11–51)

## 2017-07-22 NOTE — ED Notes (Signed)
Pt reports he was [facility].  Sts the admission was cardiac related.  However, after a chart review, it looks like he was admitted for a prolonged QT and cannibas induced hyperemesis.  Pt reports he has been feeling bad since discharge.  Pt also stated his legs were numb and would not move.  However, the Pt was able to stand and take several steps into wheelchair and again, into lobby chair.

## 2017-07-22 NOTE — ED Notes (Signed)
This RN watched patient walk around in lobby and make his way to vending machine. Patient lowered himself to the ground and laid down. This RN and two other staff members requested patient move off the floor. Patient refused saying he did not trust wheelchairs. 3 staff members assisted patient into wheelchair and reassured him of care.

## 2017-07-22 NOTE — ED Notes (Signed)
Pt called from the lobby with no response x3 

## 2017-07-22 NOTE — ED Notes (Signed)
Dylan, NT called patient for updates on vital signs with no answer.

## 2017-07-22 NOTE — ED Notes (Signed)
Patient no longer noted in lobby.

## 2017-07-22 NOTE — ED Triage Notes (Signed)
Per EMS-history of gastroparesis-abdominal pain for 2 weeks-states has'nt taken meds today-states he doesn't like taking pain meds due to constipation and possible addiction-no vomiting, some nausea

## 2018-05-06 ENCOUNTER — Emergency Department (HOSPITAL_BASED_OUTPATIENT_CLINIC_OR_DEPARTMENT_OTHER): Payer: Self-pay

## 2018-05-06 ENCOUNTER — Encounter (HOSPITAL_BASED_OUTPATIENT_CLINIC_OR_DEPARTMENT_OTHER): Payer: Self-pay | Admitting: *Deleted

## 2018-05-06 ENCOUNTER — Observation Stay (HOSPITAL_COMMUNITY): Payer: Self-pay

## 2018-05-06 ENCOUNTER — Other Ambulatory Visit: Payer: Self-pay

## 2018-05-06 ENCOUNTER — Inpatient Hospital Stay (HOSPITAL_BASED_OUTPATIENT_CLINIC_OR_DEPARTMENT_OTHER)
Admission: EM | Admit: 2018-05-06 | Discharge: 2018-05-09 | DRG: 392 | Disposition: A | Discharge status: Home / Self Care | Payer: Self-pay | Attending: Family Medicine | Admitting: Family Medicine

## 2018-05-06 DIAGNOSIS — R1011 Right upper quadrant pain: Secondary | ICD-10-CM

## 2018-05-06 DIAGNOSIS — E860 Dehydration: Secondary | ICD-10-CM | POA: Diagnosis present

## 2018-05-06 DIAGNOSIS — R197 Diarrhea, unspecified: Secondary | ICD-10-CM | POA: Diagnosis present

## 2018-05-06 DIAGNOSIS — R112 Nausea with vomiting, unspecified: Secondary | ICD-10-CM | POA: Diagnosis present

## 2018-05-06 DIAGNOSIS — D72829 Elevated white blood cell count, unspecified: Secondary | ICD-10-CM | POA: Diagnosis present

## 2018-05-06 DIAGNOSIS — E876 Hypokalemia: Secondary | ICD-10-CM | POA: Diagnosis present

## 2018-05-06 DIAGNOSIS — R030 Elevated blood-pressure reading, without diagnosis of hypertension: Secondary | ICD-10-CM | POA: Diagnosis present

## 2018-05-06 DIAGNOSIS — K219 Gastro-esophageal reflux disease without esophagitis: Secondary | ICD-10-CM | POA: Diagnosis present

## 2018-05-06 DIAGNOSIS — K279 Peptic ulcer, site unspecified, unspecified as acute or chronic, without hemorrhage or perforation: Secondary | ICD-10-CM | POA: Diagnosis present

## 2018-05-06 DIAGNOSIS — R109 Unspecified abdominal pain: Principal | ICD-10-CM | POA: Diagnosis present

## 2018-05-06 DIAGNOSIS — Z87442 Personal history of urinary calculi: Secondary | ICD-10-CM

## 2018-05-06 HISTORY — DX: Gastro-esophageal reflux disease without esophagitis: K21.9

## 2018-05-06 LAB — COMPREHENSIVE METABOLIC PANEL
ALBUMIN: 5.5 g/dL — AB (ref 3.5–5.0)
ALT: 23 U/L (ref 0–44)
ANION GAP: 15 (ref 5–15)
AST: 23 U/L (ref 15–41)
Alkaline Phosphatase: 104 U/L (ref 38–126)
BUN: 14 mg/dL (ref 6–20)
CO2: 26 mmol/L (ref 22–32)
CREATININE: 1.12 mg/dL (ref 0.61–1.24)
Calcium: 10.3 mg/dL (ref 8.9–10.3)
Chloride: 98 mmol/L (ref 98–111)
GFR calc Af Amer: >60 mL/min (ref >60)
GFR calc non Af Amer: >60 mL/min (ref >60)
GLUCOSE: 107 mg/dL — AB (ref 70–99)
Potassium: 3.2 mmol/L — ABNORMAL LOW (ref 3.5–5.1)
Sodium: 139 mmol/L (ref 135–145)
Total Bilirubin: 1.1 mg/dL (ref 0.3–1.2)
Total Protein: 9 g/dL — ABNORMAL HIGH (ref 6.5–8.1)

## 2018-05-06 LAB — CBC
HCT: 54.8 % — ABNORMAL HIGH (ref 39.0–52.0)
Hemoglobin: 18.3 g/dL — ABNORMAL HIGH (ref 13.0–17.0)
MCH: 30 pg (ref 26.0–34.0)
MCHC: 33.4 g/dL (ref 30.0–36.0)
MCV: 89.7 fL (ref 80.0–100.0)
PLATELETS: 246 10*3/uL (ref 150–400)
RBC: 6.11 MIL/uL — AB (ref 4.22–5.81)
RDW: 13.4 % (ref 11.5–15.5)
WBC: 12.6 10*3/uL — ABNORMAL HIGH (ref 4.0–10.5)
nRBC: 0 % (ref 0.0–0.2)

## 2018-05-06 LAB — URINALYSIS, COMPLETE (UACMP) WITH MICROSCOPIC
BILIRUBIN URINE: NEGATIVE
Bacteria, UA: NONE SEEN
GLUCOSE, UA: NEGATIVE mg/dL
Ketones, ur: 80 mg/dL — AB
Leukocytes, UA: NEGATIVE
Nitrite: NEGATIVE
PROTEIN: NEGATIVE mg/dL
Specific Gravity, Urine: >1.046 — ABNORMAL HIGH (ref 1.005–1.030)
pH: 6 (ref 5.0–8.0)

## 2018-05-06 LAB — LIPASE, BLOOD: Lipase: 25 U/L (ref 11–51)

## 2018-05-06 MED ORDER — ENOXAPARIN SODIUM 40 MG/0.4ML ~~LOC~~ SOLN
40.0000 mg | SUBCUTANEOUS | Status: DC
  Administered 2018-05-06 – 2018-05-08 (×3): 40 mg via SUBCUTANEOUS
  Filled 2018-05-06 (×3): qty 0.4

## 2018-05-06 MED ORDER — MORPHINE SULFATE (PF) 4 MG/ML IV SOLN
4.0000 mg | Freq: Once | INTRAVENOUS | Status: AC
  Administered 2018-05-06: 4 mg via INTRAVENOUS
  Filled 2018-05-06: qty 1

## 2018-05-06 MED ORDER — POTASSIUM CHLORIDE 20 MEQ PO PACK
40.0000 meq | PACK | ORAL | Status: AC
  Administered 2018-05-06: 40 meq via ORAL
  Filled 2018-05-06 (×2): qty 2

## 2018-05-06 MED ORDER — PANTOPRAZOLE SODIUM 40 MG PO TBEC
40.0000 mg | DELAYED_RELEASE_TABLET | Freq: Every day | ORAL | Status: DC
  Administered 2018-05-06 – 2018-05-07 (×2): 40 mg via ORAL
  Filled 2018-05-06 (×2): qty 1

## 2018-05-06 MED ORDER — SODIUM CHLORIDE 0.9 % IV BOLUS
1000.0000 mL | Freq: Once | INTRAVENOUS | Status: AC
  Administered 2018-05-06: 1000 mL via INTRAVENOUS

## 2018-05-06 MED ORDER — IOPAMIDOL (ISOVUE-300) INJECTION 61%
100.0000 mL | Freq: Once | INTRAVENOUS | Status: AC | PRN
  Administered 2018-05-06: 100 mL via INTRAVENOUS

## 2018-05-06 MED ORDER — ONDANSETRON HCL 4 MG/2ML IJ SOLN
4.0000 mg | Freq: Once | INTRAMUSCULAR | Status: AC
  Administered 2018-05-06: 4 mg via INTRAVENOUS
  Filled 2018-05-06: qty 2

## 2018-05-06 MED ORDER — SODIUM CHLORIDE 0.9 % IV SOLN
INTRAVENOUS | Status: AC
  Administered 2018-05-06 – 2018-05-07 (×3): via INTRAVENOUS

## 2018-05-06 MED ORDER — HYDRALAZINE HCL 20 MG/ML IJ SOLN: 5.0000 mg | Freq: Four times a day (QID) | INTRAMUSCULAR | Status: DC | PRN

## 2018-05-06 MED ORDER — TRAMADOL HCL 50 MG PO TABS
50.0000 mg | ORAL_TABLET | Freq: Four times a day (QID) | ORAL | Status: DC | PRN
  Administered 2018-05-07: 50 mg via ORAL
  Filled 2018-05-06: qty 1

## 2018-05-06 MED ORDER — MORPHINE SULFATE (PF) 2 MG/ML IV SOLN
1.0000 mg | INTRAVENOUS | Status: DC | PRN
  Administered 2018-05-06 – 2018-05-08 (×7): 1 mg via INTRAVENOUS
  Filled 2018-05-06 (×7): qty 1

## 2018-05-06 NOTE — ED Notes (Signed)
Report received 

## 2018-05-06 NOTE — ED Notes (Signed)
Report given to Beth,RN. Patient can be transferred upstairs once back from ultrasound.

## 2018-05-06 NOTE — ED Notes (Signed)
Surgery notified of patient's arrival

## 2018-05-06 NOTE — H&P (Signed)
[facility] Consult Note  Tom Munoz 12/06/1990  [id number].    Requesting MD: Charlesetta Shanks Chief Complaint/Reason for Consult: abdominal pain  HPI:  Tom Munoz is a 27yo male PMH GERD and nephrolithiasis, who was transferred from [place] to [facility] for evaluation of abdominal pain by general surgery. States that he started having abdominal pain and indigestion 6 days ago, and his pain has gradually gotten more severe. Pain is associated with nausea, vomiting, and diarrhea. Denies fever, chills, dysuria. Pain is right upper and lower quadrants and radiating into his back and up to his shoulder. States that it feels somewhat similar to when he had a kidney stone in the past. Also states that he has had similar symptoms in the past but they just do not last as long. Unable to keep anything down now. No known sick contacts. CT abdomen/pelvis showed presumed appendicoliths but no definite signs of acute appendicitis; no other acute findings. WBC 12.6, LFTs and lipase WNL.  No prior h/o abdominal surgery. Not on any blood thinning medications. Nonsmoker. Works for [employer] in [employer].  ROS: Review of Systems  Constitutional: Negative.   HENT: Negative.   Eyes: Negative.   Respiratory: Negative.   Cardiovascular: Negative.   Gastrointestinal: Positive for abdominal pain, diarrhea, nausea and vomiting.  Genitourinary: Negative.   Musculoskeletal: Positive for back pain.  Skin: Negative.   Neurological: Negative.    All systems reviewed and otherwise negative except for as above  No family history on file.  History reviewed. No pertinent past medical history.  History reviewed. No pertinent surgical history.  Social History:  reports that he has never smoked. He has never used smokeless tobacco. He reports that he does not drink alcohol or use drugs.  Allergies: No Known Allergies   (Not in a hospital admission)  Prior to Admission  medications   Not on File    Blood pressure (!) 150/94, pulse 66, temperature 98.7 F (37.1 C), temperature source Oral, resp. rate 20, height _0  (1.676 m), weight 72.6 kg, SpO2 100 %. Physical Exam: General: pleasant, WD/WN male who is laying in bed in NAD HEENT: head is normocephalic, atraumatic.  Sclera are noninjected.  Pupils equal and round.  Ears and nose without any masses or lesions.  Mouth is pink and moist. Dentition fair Heart: regular, rate, and rhythm.  No obvious murmurs, gallops, or rubs noted.  Palpable pedal pulses bilaterally Lungs: CTAB, no wheezes, rhonchi, or rales noted.  Respiratory effort nonlabored Abd: soft, ND, +BS, no masses, hernias, or organomegaly. +TTP RUQ and RLQ, right-sided abdominal pain with palpating the left hemiabdomen MS: all 4 extremities are symmetrical with no cyanosis, clubbing, or edema. Skin: warm and dry with no masses, lesions, or rashes Psych: A&Ox3 with an appropriate affect. Neuro: cranial nerves grossly intact, extremity CSM intact bilaterally, normal speech  Results for orders placed or performed during the hospital encounter of 05/06/18 (from the past 48 hour(s))  Lipase, blood     Status: None   Collection Time: 05/06/18 11:50 AM  Result Value Ref Range   Lipase 25 11 - 51 U/L    Comment: Performed at [facility], [address] [place], [place] [address]  Comprehensive metabolic panel     Status: Abnormal   Collection Time: 05/06/18 11:50 AM  Result Value Ref Range   Sodium 139 135 - 145 mmol/L   Potassium 3.2 (L) 3.5 - 5.1 mmol/L   Chloride 98 98 -  111 mmol/L   CO2 26 22 - 32 mmol/L   Glucose, Bld 107 (H) 70 - 99 mg/dL   BUN 14 6 - 20 mg/dL   Creatinine, Ser 1.12 0.61 - 1.24 mg/dL   Calcium 10.3 8.9 - 10.3 mg/dL   Total Protein 9.0 (H) 6.5 - 8.1 g/dL   Albumin 5.5 (H) 3.5 - 5.0 g/dL   AST 23 15 - 41 U/L   ALT 23 0 - 44 U/L   Alkaline Phosphatase 104 38 - 126 U/L   Total Bilirubin 1.1 0.3 - 1.2 mg/dL    GFR calc non Af Amer >60 >60 mL/min   GFR calc Af Amer >60 >60 mL/min    Comment: (NOTE) The eGFR has been calculated using the CKD EPI equation. This calculation has not been validated in all clinical situations. eGFR's persistently <60 mL/min signify possible Chronic Kidney Disease.    Anion gap 15 5 - 15    Comment: Performed at [facility], [address] [place], [place] [address]  CBC     Status: Abnormal   Collection Time: 05/06/18 11:50 AM  Result Value Ref Range   WBC 12.6 (H) 4.0 - 10.5 K/uL   RBC 6.11 (H) 4.22 - 5.81 MIL/uL   Hemoglobin 18.3 (H) 13.0 - 17.0 g/dL   HCT 54.8 (H) 39.0 - 52.0 %   MCV 89.7 80.0 - 100.0 fL   MCH 30.0 26.0 - 34.0 pg   MCHC 33.4 30.0 - 36.0 g/dL   RDW 13.4 11.5 - 15.5 %   Platelets 246 150 - 400 K/uL   nRBC 0.0 0.0 - 0.2 %    Comment: Performed at [facility], [address] [place], [place] [address]   Ct Abdomen Pelvis W Contrast  Result Date: 05/06/2018 CLINICAL DATA:  Right-sided abdominal pain with nausea and vomiting for 6 days EXAM: CT ABDOMEN AND PELVIS WITH CONTRAST TECHNIQUE: Multidetector CT imaging of the abdomen and pelvis was performed using the standard protocol following bolus administration of intravenous contrast. CONTRAST:  130m ISOVUE-300 IOPAMIDOL (ISOVUE-300) INJECTION 61% COMPARISON:  None. FINDINGS: Lower chest: Lung bases are clear. Hepatobiliary: No focal liver lesions are evident. Gallbladder wall is not appreciably thickened. There is no biliary duct dilatation. Pancreas: There is no pancreatic mass or inflammatory focus. Spleen: No splenic lesions are evident. Adrenals/Urinary Tract: Adrenals bilaterally appear unremarkable. Kidneys bilaterally show no evident mass or hydronephrosis on either side. There is no appreciable renal or ureteral calculus on either side. Urinary bladder is midline with wall thickness within normal limits. Stomach/Bowel: There is no appreciable bowel wall or mesenteric  thickening. There is no evident bowel obstruction. No free air or portal venous air. Vascular/Lymphatic: No abdominal aortic aneurysm. No vascular lesions are evident. There is no appreciable adenopathy in the abdomen or pelvis. Reproductive: Prostate and seminal vesicles appear normal in size and contour. No pelvic mass evident. Other: There are calcifications within the appendix, likely appendicoliths. There is no appendiceal distention or wall thickening. There is no periappendiceal region inflammation. There is no abscess in the abdomen or pelvis. Musculoskeletal: There are no blastic or lytic bone lesions. There is no abdominal wall or intramuscular lesion. IMPRESSION: 1. Presumed appendicoliths within the appendix. Appendix otherwise appears normal without demonstrable appendiceal region inflammation. The surrounding mesentery in this area appears normal, and there is no abnormal fluid in this area. 2. No bowel wall thickening or bowel obstruction evident. No abscess in the abdomen or pelvis.  3.  No evident renal or ureteral calculus.  No hydronephrosis. Electronically Signed   By: Lowella Grip III M.D.   On: 05/06/2018 13:37      Assessment/Plan GERD - prilosec Nephrolithiasis - 1 prior kidney stone in 7th grade  Right sided abdominal pain, nausea, vomiting, diarrhea - Patient with 6 days of worsening abdominal pain, nausea, vomiting, and diarrhea. No definite findings of acute appendicitis on CT scan, and likely if he had appendicitis brewing for 6 days there would be signs on his CT scan. Will order an abdominal u/s to evaluate his gallbladder. Other diagnoses on the differential include gastroenteritis, possible nephrolithiasis, IBS. Recommend admission to medicine for observation and rehydration. Could consider GI consultation. Will continue to follow.   Wellington Hampshire, [facility] 05/06/2018, 4:17 PM Pager: [phone] Mon 7:00 am -11:30 AM Tues-Fri 7:00 am-4:30  pm Sat-Sun 7:00 am-11:30 am

## 2018-05-06 NOTE — ED Notes (Signed)
ED TO INPATIENT HANDOFF REPORT  Name/Age/Gender Tom Munoz 27 y.o. male  Code Status   Home/SNF/Other Home  Chief Complaint SEVERE RIGHT SIDE PAIN  Level of Care/Admitting Diagnosis ED Disposition    ED Disposition Condition Comment   Admit  Hospital Area: [facility] [[id number]]  Level of Care: Med-Surg [16]  Diagnosis: Abdominal pain [620355]  Admitting Physician: Desiree Hane [[id number]]  Attending Physician: Desiree Hane [phone]  PT Class (Do Not Modify): Observation [104]  PT Acc Code (Do Not Modify): Observation [10022]       Medical History History reviewed. No pertinent past medical history.  Allergies No Known Allergies  IV Location/Drains/Wounds Patient Lines/Drains/Airways Status   Active Line/Drains/Airways    Name:   Placement date:   Placement time:   Site:   Days:   Peripheral IV 05/06/18 Right Antecubital   05/06/18    1151    Antecubital   less than 1          Labs/Imaging Results for orders placed or performed during the hospital encounter of 05/06/18 (from the past 48 hour(s))  Lipase, blood     Status: None   Collection Time: 05/06/18 11:50 AM  Result Value Ref Range   Lipase 25 11 - 51 U/L    Comment: Performed at [facility], [address] [place], [place] [address]  Comprehensive metabolic panel     Status: Abnormal   Collection Time: 05/06/18 11:50 AM  Result Value Ref Range   Sodium 139 135 - 145 mmol/L   Potassium 3.2 (L) 3.5 - 5.1 mmol/L   Chloride 98 98 - 111 mmol/L   CO2 26 22 - 32 mmol/L   Glucose, Bld 107 (H) 70 - 99 mg/dL   BUN 14 6 - 20 mg/dL   Creatinine, Ser 1.12 0.61 - 1.24 mg/dL   Calcium 10.3 8.9 - 10.3 mg/dL   Total Protein 9.0 (H) 6.5 - 8.1 g/dL   Albumin 5.5 (H) 3.5 - 5.0 g/dL   AST 23 15 - 41 U/L   ALT 23 0 - 44 U/L   Alkaline Phosphatase 104 38 - 126 U/L   Total Bilirubin 1.1 0.3 - 1.2 mg/dL   GFR calc non Af Amer >60 >60 mL/min   GFR calc Af Amer >60 >60 mL/min     Comment: (NOTE) The eGFR has been calculated using the CKD EPI equation. This calculation has not been validated in all clinical situations. eGFR's persistently <60 mL/min signify possible Chronic Kidney Disease.    Anion gap 15 5 - 15    Comment: Performed at [facility] [address] [place], [place] [address]  CBC     Status: Abnormal   Collection Time: 05/06/18 11:50 AM  Result Value Ref Range   WBC 12.6 (H) 4.0 - 10.5 K/uL   RBC 6.11 (H) 4.22 - 5.81 MIL/uL   Hemoglobin 18.3 (H) 13.0 - 17.0 g/dL   HCT 54.8 (H) 39.0 - 52.0 %   MCV 89.7 80.0 - 100.0 fL   MCH 30.0 26.0 - 34.0 pg   MCHC 33.4 30.0 - 36.0 g/dL   RDW 13.4 11.5 - 15.5 %   Platelets 246 150 - 400 K/uL   nRBC 0.0 0.0 - 0.2 %    Comment: Performed at [facility], [address] [place], [place] [address]   Ct Abdomen Pelvis W Contrast  Result Date: 05/06/2018 CLINICAL DATA:  Right-sided abdominal  pain with nausea and vomiting for 6 days EXAM: CT ABDOMEN AND PELVIS WITH CONTRAST TECHNIQUE: Multidetector CT imaging of the abdomen and pelvis was performed using the standard protocol following bolus administration of intravenous contrast. CONTRAST:  142m ISOVUE-300 IOPAMIDOL (ISOVUE-300) INJECTION 61% COMPARISON:  None. FINDINGS: Lower chest: Lung bases are clear. Hepatobiliary: No focal liver lesions are evident. Gallbladder wall is not appreciably thickened. There is no biliary duct dilatation. Pancreas: There is no pancreatic mass or inflammatory focus. Spleen: No splenic lesions are evident. Adrenals/Urinary Tract: Adrenals bilaterally appear unremarkable. Kidneys bilaterally show no evident mass or hydronephrosis on either side. There is no appreciable renal or ureteral calculus on either side. Urinary bladder is midline with wall thickness within normal limits. Stomach/Bowel: There is no appreciable bowel wall or mesenteric thickening. There is no evident bowel obstruction. No free air or portal  venous air. Vascular/Lymphatic: No abdominal aortic aneurysm. No vascular lesions are evident. There is no appreciable adenopathy in the abdomen or pelvis. Reproductive: Prostate and seminal vesicles appear normal in size and contour. No pelvic mass evident. Other: There are calcifications within the appendix, likely appendicoliths. There is no appendiceal distention or wall thickening. There is no periappendiceal region inflammation. There is no abscess in the abdomen or pelvis. Musculoskeletal: There are no blastic or lytic bone lesions. There is no abdominal wall or intramuscular lesion. IMPRESSION: 1. Presumed appendicoliths within the appendix. Appendix otherwise appears normal without demonstrable appendiceal region inflammation. The surrounding mesentery in this area appears normal, and there is no abnormal fluid in this area. 2. No bowel wall thickening or bowel obstruction evident. No abscess in the abdomen or pelvis. 3.  No evident renal or ureteral calculus.  No hydronephrosis. Electronically Signed   By: WLowella GripIII M.D.   On: 05/06/2018 13:37   None  Pending Labs Unresulted Labs (From admission, onward)    Start     Ordered   05/06/18 1127  Urinalysis, Routine w reflex microscopic  STAT,   STAT     05/06/18 1126   Signed and Held  HIV antibody (Routine Testing)  Once,   R     Signed and Held   Signed and Held  Urinalysis, Complete w Microscopic  Once,   R     Signed and Held   Signed and Held  Comprehensive metabolic panel  Tomorrow morning,   R     Signed and Held   Signed and Held  CBC  Tomorrow morning,   R     Signed and Held          Vitals/Pain Today's Vitals   05/06/18 1119 05/06/18 1124 05/06/18 1510  BP: (!) 158/104  (!) 150/94  Pulse: 94  66  Resp: 20  20  Temp: 98.7 F (37.1 C)    TempSrc: Oral    SpO2: 99%  100%  Weight: 72.6 kg    Height: 5' 6"  (1.676 m)    PainSc:  7      Isolation Precautions No active isolations  Medications Medications   sodium chloride 0.9 % bolus 1,000 mL (0 mLs Intravenous Stopped 05/06/18 1425)  ondansetron (ZOFRAN) injection 4 mg (4 mg Intravenous Given 05/06/18 1234)  morphine 4 MG/ML injection 4 mg (4 mg Intravenous Given 05/06/18 1234)  iopamidol (ISOVUE-300) 61 % injection 100 mL (100 mLs Intravenous Contrast Given 05/06/18 1300)  morphine 4 MG/ML injection 4 mg (4 mg Intravenous Given 05/06/18 1433)    Mobility walks with person assist

## 2018-05-06 NOTE — ED Notes (Signed)
Patient transported to Ultrasound 

## 2018-05-06 NOTE — ED Triage Notes (Signed)
Abdominal pain x 6 days along with vomiting and diarrhea. Pain is right mid quadrant. Denies alcohol use.

## 2018-05-06 NOTE — ED Provider Notes (Signed)
[facility] EMERGENCY DEPARTMENT Provider Note   CSN: [id number] Arrival date & time: 05/06/18  1107     History   Chief Complaint Chief Complaint  Patient presents with  . Abdominal Pain    HPI Tom Munoz is a 27 y.o. male who is previously healthy who presents with a 6-day history of right-sided abdominal pain.  He has had associated nausea, vomiting, and diarrhea.  He has began to see some blood streaking in his emesis.  He has had pain in his mid to lower back on the right side as well as some pain up into his chest.  He reports he has family history of gallbladder problems.  He was told that a few months ago, "something was cooking."Patient denies any fevers.  He has not taken any medication at home for symptoms.  Patient reports initially his symptoms were intermittent, but now they have been constant.  HPI  History reviewed. No pertinent past medical history.  There are no active problems to display for this patient.   History reviewed. No pertinent surgical history.      Home Medications    Prior to Admission medications   Not on File    Family History No family history on file.  Social History Social History   Tobacco Use  . Smoking status: Never Smoker  . Smokeless tobacco: Never Used  Substance Use Topics  . Alcohol use: Never    Frequency: Never  . Drug use: Never     Allergies   Patient has no known allergies.   Review of Systems Review of Systems  Constitutional: Negative for chills and fever.  HENT: Negative for facial swelling and sore throat.   Respiratory: Negative for shortness of breath.   Cardiovascular: Positive for chest pain.  Gastrointestinal: Positive for abdominal pain, diarrhea, nausea and vomiting. Negative for blood in stool.  Genitourinary: Positive for decreased urine volume. Negative for dysuria.  Musculoskeletal: Negative for back pain.  Skin: Negative for rash and wound.  Neurological: Negative for  headaches.  Psychiatric/Behavioral: The patient is not nervous/anxious.      Physical Exam Updated Vital Signs BP (!) 150/94 (BP Location: Left Arm)   Pulse 66   Temp 98.7 F (37.1 C) (Oral)   Resp 20   Ht 5\' 6"  (1.676 m)   Wt 72.6 kg   SpO2 100%   BMI 25.82 kg/m   Physical Exam  Constitutional: He appears well-developed and well-nourished. No distress.  HENT:  Head: Normocephalic and atraumatic.  Mouth/Throat: Oropharynx is clear and moist. No oropharyngeal exudate.  Eyes: Pupils are equal, round, and reactive to light. Conjunctivae are normal. Right eye exhibits no discharge. Left eye exhibits no discharge. No scleral icterus.  Neck: Normal range of motion. Neck supple. No thyromegaly present.  Cardiovascular: Normal rate, regular rhythm, normal heart sounds and intact distal pulses. Exam reveals no gallop and no friction rub.  No murmur heard. Pulmonary/Chest: Effort normal and breath sounds normal. No stridor. No respiratory distress. He has no wheezes. He has no rales.  Abdominal: Soft. Bowel sounds are normal. He exhibits no distension. There is tenderness in the right upper quadrant, right lower quadrant, suprapubic area and left lower quadrant. There is guarding, CVA tenderness and tenderness at McBurney's point. There is no rebound.  +Rovsing's sign  Musculoskeletal: He exhibits no edema.  Lymphadenopathy:    He has no cervical adenopathy.  Neurological: He is alert. Coordination normal.  Skin: Skin is warm and dry. No rash  noted. He is not diaphoretic. No pallor.  Psychiatric: He has a normal mood and affect.  Nursing note and vitals reviewed.    ED Treatments / Results  Labs (all labs ordered are listed, but only abnormal results are displayed) Labs Reviewed  COMPREHENSIVE METABOLIC PANEL - Abnormal; Notable for the following components:      Result Value   Potassium 3.2 (*)    Glucose, Bld 107 (*)    Total Protein 9.0 (*)    Albumin 5.5 (*)    All other  components within normal limits  CBC - Abnormal; Notable for the following components:   WBC 12.6 (*)    RBC 6.11 (*)    Hemoglobin 18.3 (*)    HCT 54.8 (*)    All other components within normal limits  LIPASE, BLOOD  URINALYSIS, ROUTINE W REFLEX MICROSCOPIC    EKG None  Radiology Ct Abdomen Pelvis W Contrast  Result Date: 05/06/2018 CLINICAL DATA:  Right-sided abdominal pain with nausea and vomiting for 6 days EXAM: CT ABDOMEN AND PELVIS WITH CONTRAST TECHNIQUE: Multidetector CT imaging of the abdomen and pelvis was performed using the standard protocol following bolus administration of intravenous contrast. CONTRAST:  ISOVUE-300 IOPAMIDOL (ISOVUE-300) INJECTION 61% COMPARISON:  None. FINDINGS: Lower chest: Lung bases are clear. Hepatobiliary: No focal liver lesions are evident. Gallbladder wall is not appreciably thickened. There is no biliary duct dilatation. Pancreas: There is no pancreatic mass or inflammatory focus. Spleen: No splenic lesions are evident. Adrenals/Urinary Tract: Adrenals bilaterally appear unremarkable. Kidneys bilaterally show no evident mass or hydronephrosis on either side. There is no appreciable renal or ureteral calculus on either side. Urinary bladder is midline with wall thickness within normal limits. Stomach/Bowel: There is no appreciable bowel wall or mesenteric thickening. There is no evident bowel obstruction. No free air or portal venous air. Vascular/Lymphatic: No abdominal aortic aneurysm. No vascular lesions are evident. There is no appreciable adenopathy in the abdomen or pelvis. Reproductive: Prostate and seminal vesicles appear normal in size and contour. No pelvic mass evident. Other: There are calcifications within the appendix, likely appendicoliths. There is no appendiceal distention or wall thickening. There is no periappendiceal region inflammation. There is no abscess in the abdomen or pelvis. Musculoskeletal: There are no blastic or lytic  bone lesions. There is no abdominal wall or intramuscular lesion. IMPRESSION: 1. Presumed appendicoliths within the appendix. Appendix otherwise appears normal without demonstrable appendiceal region inflammation. The surrounding mesentery in this area appears normal, and there is no abnormal fluid in this area. 2. No bowel wall thickening or bowel obstruction evident. No abscess in the abdomen or pelvis. 3.  No evident renal or ureteral calculus.  No hydronephrosis. Electronically Signed   By: Bretta Bang III M.D.   On: 05/06/2018 13:37    Procedures Procedures (including critical care time)  Medications Ordered in ED Medications  sodium chloride 0.9 % bolus 1,000 mL (0 mLs Intravenous Stopped 05/06/18 1425)  ondansetron (ZOFRAN) injection 4 mg (4 mg Intravenous Given 05/06/18 1234)  morphine 4 MG/ML injection 4 mg (4 mg Intravenous Given 05/06/18 1234)  iopamidol (ISOVUE-300) 61 % injection 100 mL (100 mLs Intravenous Contrast Given 05/06/18 1300)  morphine 4 MG/ML injection 4 mg (4 mg Intravenous Given 05/06/18 1433)     Initial Impression / Assessment and Plan / ED Course  I have reviewed the triage vital signs and the nursing notes.  Pertinent labs & imaging results that were available during my care of the patient were  reviewed by me and considered in my medical decision making (see chart for details).     Patient presenting with right lower quadrant pain for 6 days.  CBC shows hemoglobin 12.6, hemoglobin 18.3.  CMP shows potassium 3.2, protein 9.0, albumin 5.5.  Lipase is within normal limits.  Patient given fluids, morphine, Zofran with good control of pain and nausea.  CT abdomen pelvis shows appendicoliths.  I spoke with Nehemiah Settle from general surgery who advised transfer to [facility] for evaluation by the surgery service.  Patient understands and agrees with plan.  [facility] will transfer.  Final Clinical Impressions(s) / ED Diagnoses   Final diagnoses:  None    ED  Discharge Orders    None       Emi Holes, PA-C 05/06/18 1600    Tegeler, Canary Brim, MD 05/07/18 1452

## 2018-05-06 NOTE — ED Provider Notes (Signed)
[facility]-EMERGENCY DEPT Provider Note   CSN: [id number] Arrival date & time: 05/06/18  1107   History   Chief Complaint Chief Complaint  Patient presents with  . Abdominal Pain    HPI Tom Munoz is a 27 y.o. male with past medical history significant for nephrolithiasis who presents for evaluation of abdominal and back pain. Patient was see at [facility] by Glenford Bayley PA-C where she consulted with Carlena Bjornstad, PA-C with general surgery who recommended transfer to [facility] for evaluation of possible appendicitis. General Surgery was notified on patients arrival. Patient with 6 days of RUQ and RLQ pain. Pain is constant however has been gradually increasing. Admits to reflux symptoms, nausea, non-bloody emesis and non-bloody diarrhea. Denies fever, chills, dysuria, SOB, chest pain. Patient states they this feels similar to previous episode of kidney stones. Admits to family history of cholelithiasis. Denies previous history of abdominal surgeries.  HPI  History reviewed. No pertinent past medical history.  There are no active problems to display for this patient.   History reviewed. No pertinent surgical history.      Home Medications    Prior to Admission medications   Not on File    Family History No family history on file.  Social History Social History   Tobacco Use  . Smoking status: Never Smoker  . Smokeless tobacco: Never Used  Substance Use Topics  . Alcohol use: Never    Frequency: Never  . Drug use: Never     Allergies   Patient has no known allergies.   Review of Systems Review of Systems  Respiratory: Negative.   Cardiovascular: Negative.   Gastrointestinal: Positive for abdominal pain, diarrhea, nausea and vomiting. Negative for abdominal distention, anal bleeding, blood in stool, constipation and rectal pain.  Genitourinary: Positive for flank pain. Negative for decreased urine volume, difficulty urinating, discharge, dysuria,  frequency, hematuria, penile pain, penile swelling, scrotal swelling, testicular pain and urgency.  Musculoskeletal: Positive for back pain. Negative for arthralgias, gait problem, neck pain and neck stiffness.  Skin: Negative.   Neurological: Negative.   All other systems reviewed and are negative.    Physical Exam Updated Vital Signs BP (!) 150/94 (BP Location: Left Arm)   Pulse 66   Temp 98.7 F (37.1 C) (Oral)   Resp 20   Ht 5\' 6"  (1.676 m)   Wt 72.6 kg   SpO2 100%   BMI 25.82 kg/m   Physical Exam  Constitutional: He appears well-developed and well-nourished.  Non-toxic appearance. He does not appear ill. No distress.  HENT:  Head: Atraumatic.  Mouth/Throat: Oropharynx is clear and moist.  Eyes: Pupils are equal, round, and reactive to light.  Neck: Normal range of motion. Neck supple.  Cardiovascular: Normal rate and regular rhythm.  Pulmonary/Chest: Effort normal. No respiratory distress.  Abdominal: Soft. Normal appearance and bowel sounds are normal. He exhibits no shifting dullness, no distension, no pulsatile liver, no fluid wave, no abdominal bruit, no ascites, no pulsatile midline mass and no mass. There is no hepatosplenomegaly. There is tenderness in the right upper quadrant and right lower quadrant. There is no rigidity and no guarding. No hernia.  Musculoskeletal: Normal range of motion.  Neurological: He is alert.  Skin: Skin is warm and dry. He is not diaphoretic.  Psychiatric: He has a normal mood and affect.  Nursing note and vitals reviewed.    ED Treatments / Results  Labs (all labs ordered are listed, but only abnormal results are displayed) Labs  Reviewed  COMPREHENSIVE METABOLIC PANEL - Abnormal; Notable for the following components:      Result Value   Potassium 3.2 (*)    Glucose, Bld 107 (*)    Total Protein 9.0 (*)    Albumin 5.5 (*)    All other components within normal limits  CBC - Abnormal; Notable for the following components:   WBC  12.6 (*)    RBC 6.11 (*)    Hemoglobin 18.3 (*)    HCT 54.8 (*)    All other components within normal limits  LIPASE, BLOOD  URINALYSIS, ROUTINE W REFLEX MICROSCOPIC    EKG None  Radiology Ct Abdomen Pelvis W Contrast  Result Date: 05/06/2018 CLINICAL DATA:  Right-sided abdominal pain with nausea and vomiting for 6 days EXAM: CT ABDOMEN AND PELVIS WITH CONTRAST TECHNIQUE: Multidetector CT imaging of the abdomen and pelvis was performed using the standard protocol following bolus administration of intravenous contrast. CONTRAST:  ISOVUE-300 IOPAMIDOL (ISOVUE-300) INJECTION 61% COMPARISON:  None. FINDINGS: Lower chest: Lung bases are clear. Hepatobiliary: No focal liver lesions are evident. Gallbladder wall is not appreciably thickened. There is no biliary duct dilatation. Pancreas: There is no pancreatic mass or inflammatory focus. Spleen: No splenic lesions are evident. Adrenals/Urinary Tract: Adrenals bilaterally appear unremarkable. Kidneys bilaterally show no evident mass or hydronephrosis on either side. There is no appreciable renal or ureteral calculus on either side. Urinary bladder is midline with wall thickness within normal limits. Stomach/Bowel: There is no appreciable bowel wall or mesenteric thickening. There is no evident bowel obstruction. No free air or portal venous air. Vascular/Lymphatic: No abdominal aortic aneurysm. No vascular lesions are evident. There is no appreciable adenopathy in the abdomen or pelvis. Reproductive: Prostate and seminal vesicles appear normal in size and contour. No pelvic mass evident. Other: There are calcifications within the appendix, likely appendicoliths. There is no appendiceal distention or wall thickening. There is no periappendiceal region inflammation. There is no abscess in the abdomen or pelvis. Musculoskeletal: There are no blastic or lytic bone lesions. There is no abdominal wall or intramuscular lesion. IMPRESSION: 1. Presumed  appendicoliths within the appendix. Appendix otherwise appears normal without demonstrable appendiceal region inflammation. The surrounding mesentery in this area appears normal, and there is no abnormal fluid in this area. 2. No bowel wall thickening or bowel obstruction evident. No abscess in the abdomen or pelvis. 3.  No evident renal or ureteral calculus.  No hydronephrosis. Electronically Signed   By: Bretta Bang III M.D.   On: 05/06/2018 13:37    Procedures Procedures (including critical care time)  Medications Ordered in ED Medications  sodium chloride 0.9 % bolus 1,000 mL (0 mLs Intravenous Stopped 05/06/18 1425)  ondansetron (ZOFRAN) injection 4 mg (4 mg Intravenous Given 05/06/18 1234)  morphine 4 MG/ML injection 4 mg (4 mg Intravenous Given 05/06/18 1234)  iopamidol (ISOVUE-300) 61 % injection 100 mL (100 mLs Intravenous Contrast Given 05/06/18 1300)  morphine 4 MG/ML injection 4 mg (4 mg Intravenous Given 05/06/18 1433)     Initial Impression / Assessment and Plan / ED Course  I have reviewed the triage vital signs and the nursing notes.  Pertinent labs & imaging results that were available during my care of the patient were reviewed by me and considered in my medical decision making (see chart for details).  27 year old male with no significant medical history who appears otherwise well presents for evaluation of abdominal pain.  Transferred from [facility] to [facility]  for  evaluation by general surgery service. CT with appendicolith.  Leukocytosis at 12.6. Hypokalemia at 3.2. Tenderness on exam to RUQ and RLQ. No rebound or guarding.  Carlena Bjornstad, PA-C with general surgery has evaluated patient.  No definitive diagnosis at this time. Possible nephrolithiasis vs cholelithiasis vs appendicitis.  [place] PA-C  with recommendations for Hospitalist to admit and general surgery to consult for further workup.  1635: Will consult with [facility] for admission.    [facility] Dr. Caleb Popp agrees for admission.    Final Clinical Impressions(s) / ED Diagnoses   Final diagnoses:  RUQ pain    ED Discharge Orders    None       Henderly, Britni A, PA-C 05/06/18 2359    Arby Barrette, MD 05/07/18 1728

## 2018-05-06 NOTE — H&P (Addendum)
History and Physical  Tom Munoz ZOX:[id number] DOB: 01/22/1991 DOA: 05/06/2018   PCP: Patient, No Pcp Per  Patient coming from:Home Chief Complaint: abdominal pain   HPI: Tom Munoz is a 27 y.o. male with medical history significant for acid reflux who presents on 05/06/2018 with worsening abdominal pain, nausea, vomiting, and diarrhea ongoing for a week.  He reports history of reflux for which he had an EGD about 3 years ago and was started on prilosec at that time.  Since then he has lost 75 pounds unintentionally with episodic abdominal pain that typically improves with time. However, this time even with his prilosec it did not improve. He has avoided eating as he is unable to keep anything down with all the vomiting. The pain is sharp and starts in the upper right abdomen and radiates around to his back, it gets better with rest. Food is not a trigger, but he's also not been able to eat. His emesis has been nonbilious until yesterday when it looked black per him. The diarrhea started 2 days after the abdominal pain and is watery bowel movements with mucus as well. Denies any sick contacts, fevers, chills, chest pain, urinary frequency or dysuria.    He reports a family history gallbladder disease where his mom and all 10 of her siblings have had their gallbladder removed and her dad died due to gallbladder complications.     He also states he had surgery approximately 10 years ago at [facility] where his appendix was removed.  CT imaging in ED shows normal appendix except for appendicoliths. Surgery was consulted for possible appendicitis but thought less likely based off unremarkable CT imaging despite 1 week of abodminal pain and recommend admission to medicine for IV hydration and pain control while he gets right upper quadrant ultrasound.    Review of Systems:As mentioned in the history of present illness.Review of systems are otherwise negative .   Past Medical History:  Diagnosis  Date  . Acid reflux    History reviewed. No pertinent surgical history. No Known Allergies Social History:  reports that he has never smoked. He has never used smokeless tobacco. He reports that he has current or past drug history. Drug: Marijuana. He reports that he does not drink alcohol. Family History  Problem Relation Age of Onset  . Gallbladder disease Mother      Prior to Admission medications   Not on File    Physical Exam: BP (!) 150/94 (BP Location: Left Arm)   Pulse 66   Temp 98.7 F (37.1 C) (Oral)   Resp 20   Ht 5\' 6"  (1.676 m)   Wt 72.6 kg   SpO2 100%   BMI 25.82 kg/m   Constitutional normal appearing male, uncomfortable but no acute distress Eyes: EOMI, anicteric, normal conjunctivae ENMT: Oropharynx with dry mucous membranes, normal dentition Cardiovascular: RRR no MRGs, with no peripheral edema Respiratory: Normal respiratory effort on room, clear breath sounds  Abdomen: Soft, tenderness to palpation in RUQ, minimal guarding no rebound tenderness, normal bowel sounds Skin: No rash ulcers, or lesions. Without skin tenting  Neurologic: Grossly no focal neuro deficit. Psychiatric:Appropriate affect, and mood. Mental status AAOx3          Labs on Admission:  Basic Metabolic Panel: Recent Labs  Lab 05/06/18 1150  NA 139  K 3.2*  CL 98  CO2 26  GLUCOSE 107*  BUN 14  CREATININE 1.12  CALCIUM 10.3   Liver Function Tests: Recent Labs  Lab 05/06/18  1150  AST 23  ALT 23  ALKPHOS 104  BILITOT 1.1  PROT 9.0*  ALBUMIN 5.5*   Recent Labs  Lab 05/06/18 1150  LIPASE 25   No results for input(s): AMMONIA in the last 168 hours. CBC: Recent Labs  Lab 05/06/18 1150  WBC 12.6*  HGB 18.3*  HCT 54.8*  MCV 89.7  PLT 246   Cardiac Enzymes: No results for input(s): CKTOTAL, CKMB, CKMBINDEX, TROPONINI in the last 168 hours.  BNP (last 3 results) No results for input(s): BNP in the last 8760 hours.  ProBNP (last 3 results) No results for  input(s): PROBNP in the last 8760 hours.  CBG: No results for input(s): GLUCAP in the last 168 hours.  Radiological Exams on Admission: Ct Abdomen Pelvis W Contrast  Result Date: 05/06/2018 CLINICAL DATA:  Right-sided abdominal pain with nausea and vomiting for 6 days EXAM: CT ABDOMEN AND PELVIS WITH CONTRAST TECHNIQUE: Multidetector CT imaging of the abdomen and pelvis was performed using the standard protocol following bolus administration of intravenous contrast. CONTRAST:  ISOVUE-300 IOPAMIDOL (ISOVUE-300) INJECTION 61% COMPARISON:  None. FINDINGS: Lower chest: Lung bases are clear. Hepatobiliary: No focal liver lesions are evident. Gallbladder wall is not appreciably thickened. There is no biliary duct dilatation. Pancreas: There is no pancreatic mass or inflammatory focus. Spleen: No splenic lesions are evident. Adrenals/Urinary Tract: Adrenals bilaterally appear unremarkable. Kidneys bilaterally show no evident mass or hydronephrosis on either side. There is no appreciable renal or ureteral calculus on either side. Urinary bladder is midline with wall thickness within normal limits. Stomach/Bowel: There is no appreciable bowel wall or mesenteric thickening. There is no evident bowel obstruction. No free air or portal venous air. Vascular/Lymphatic: No abdominal aortic aneurysm. No vascular lesions are evident. There is no appreciable adenopathy in the abdomen or pelvis. Reproductive: Prostate and seminal vesicles appear normal in size and contour. No pelvic mass evident. Other: There are calcifications within the appendix, likely appendicoliths. There is no appendiceal distention or wall thickening. There is no periappendiceal region inflammation. There is no abscess in the abdomen or pelvis. Musculoskeletal: There are no blastic or lytic bone lesions. There is no abdominal wall or intramuscular lesion. IMPRESSION: 1. Presumed appendicoliths within the appendix. Appendix otherwise appears  normal without demonstrable appendiceal region inflammation. The surrounding mesentery in this area appears normal, and there is no abnormal fluid in this area. 2. No bowel wall thickening or bowel obstruction evident. No abscess in the abdomen or pelvis. 3.  No evident renal or ureteral calculus.  No hydronephrosis. Electronically Signed   By: Bretta Bang III M.D.   On: 05/06/2018 13:37    EKG: None obtained Assessment/Plan Present on Admission: . Abdominal pain . Intractable nausea and vomiting . Leukocytosis . Diarrhea  Active Problems:   Abdominal pain   Intractable nausea and vomiting   Leukocytosis   Diarrhea  Acute on chronic abdominal pain, worsening. RUQ abdominal pain concerning for gallbladder anatomy especially considering significant family history of "gallbladder disease" all requiring surgery. Has longstanding chronic abdominal pain with episodic flares for 3 years with 75 pounds of unintentional weight loss since then.  CT shows appendocoliths but otherwise nothing acute, lipase wnl, LFTs unremarkable.  Surgery recommends RUQ u/s as they do not think this consistent with appendicitis, pending results may need GI consult.  Check UA for completion. Pain control : mild PRN tylenol, moderate PRN tramadol, PRN IV morphine severe pain.   Intractable nausea and vomiting, due to above. IV antiemetics  PRN. NPO for now. Timed IVF for dehydration given no PO intake  Hypokalemia. Due to above. Replete orally, repeat BMP in am  Diarrhea. Reports watery stool over 2 days despite minimal PO intake. No recent antibiotics, no history of C.diff. C diff if provides stool sample here given concurrent abdominal pain.   Leukocytosis. Suspect stress leukocytosis related to abdominal pain. No fevers and no signs of sepsis. Daily CBC  Elevated blood pressure. SBP in 150s. Likely related to acute pain. No history of hypertension. Monitor. IV hydralazine PRN SBP> 180, DBP> 100  GERD. Reports  EGD 3 years ago at [facility] unable to find in chart. Takes prilosec at home, start protonix here  Elevated hemoglobin. Seems hemoconcentrated related to dehydration from decreased PO intake and diarrhea.    DVT prophylaxis: lovenox  Code Status: Full code discussed on admission   Family Communication: Family updated at bedside.   Disposition Plan: IV pain control, IV antiemetics, RUQ ultrasound   Consults called: Surgery( by EDP)   Admission status: Admitted as observation/med-surge unit. Expect discharge in less than 2 midnights with pain control and hydration.     Laverna Peace MD [facility]  Pager [phone]  If 7PM-7AM, please contact night-coverage [email or web address] Password [facility]  05/06/2018, 5:40 PM

## 2018-05-07 DIAGNOSIS — R1011 Right upper quadrant pain: Secondary | ICD-10-CM

## 2018-05-07 LAB — CBC
HCT: 45.3 % (ref 39.0–52.0)
Hemoglobin: 14.5 g/dL (ref 13.0–17.0)
MCH: 29.7 pg (ref 26.0–34.0)
MCHC: 32 g/dL (ref 30.0–36.0)
MCV: 92.6 fL (ref 80.0–100.0)
NRBC: 0 % (ref 0.0–0.2)
Platelets: 200 10*3/uL (ref 150–400)
RBC: 4.89 MIL/uL (ref 4.22–5.81)
RDW: 13.4 % (ref 11.5–15.5)
WBC: 11.4 10*3/uL — AB (ref 4.0–10.5)

## 2018-05-07 LAB — COMPREHENSIVE METABOLIC PANEL
ALT: 15 U/L (ref 0–44)
AST: 13 U/L — ABNORMAL LOW (ref 15–41)
Albumin: 3.6 g/dL (ref 3.5–5.0)
Alkaline Phosphatase: 68 U/L (ref 38–126)
Anion gap: 7 (ref 5–15)
BUN: 10 mg/dL (ref 6–20)
CO2: 27 mmol/L (ref 22–32)
CREATININE: 0.98 mg/dL (ref 0.61–1.24)
Calcium: 8.5 mg/dL — ABNORMAL LOW (ref 8.9–10.3)
Chloride: 105 mmol/L (ref 98–111)
Glucose, Bld: 93 mg/dL (ref 70–99)
Potassium: 4.1 mmol/L (ref 3.5–5.1)
Sodium: 139 mmol/L (ref 135–145)
Total Bilirubin: 1.3 mg/dL — ABNORMAL HIGH (ref 0.3–1.2)
Total Protein: 6.2 g/dL — ABNORMAL LOW (ref 6.5–8.1)

## 2018-05-07 LAB — HIV ANTIBODY (ROUTINE TESTING W REFLEX): HIV SCREEN 4TH GENERATION: NONREACTIVE

## 2018-05-07 MED ORDER — ONDANSETRON HCL 4 MG/2ML IJ SOLN: 4.0000 mg | Freq: Once | INTRAMUSCULAR | Status: DC

## 2018-05-07 MED ORDER — ONDANSETRON HCL 4 MG/2ML IJ SOLN
4.0000 mg | Freq: Four times a day (QID) | INTRAMUSCULAR | Status: DC | PRN
  Administered 2018-05-07 – 2018-05-08 (×4): 4 mg via INTRAVENOUS
  Filled 2018-05-07 (×4): qty 2

## 2018-05-07 MED ORDER — FAMOTIDINE IN NACL 20-0.9 MG/50ML-% IV SOLN
20.0000 mg | Freq: Two times a day (BID) | INTRAVENOUS | Status: DC
  Administered 2018-05-07 – 2018-05-09 (×5): 20 mg via INTRAVENOUS
  Filled 2018-05-07 (×5): qty 50

## 2018-05-07 MED ORDER — SUCRALFATE 1 GM/10ML PO SUSP
1.0000 g | Freq: Three times a day (TID) | ORAL | Status: DC
  Administered 2018-05-07 – 2018-05-08 (×4): 1 g via ORAL
  Filled 2018-05-07 (×4): qty 10

## 2018-05-07 NOTE — Progress Notes (Signed)
[facility] PROGRESS NOTE  Tyna Jaksch ZOX:[id number] DOB: 1991-04-10 DOA: 05/06/2018 PCP: Patient, No Pcp Per   Narrative: 27 year old Hispanic male with significant acid reflux Admitted with worsening nausea vomiting diarrhea-EGD 3 years ago started on Prilosec-has not taken meds in a long time with has not been able to get them   A & Plan Acute abdominal pain with nausea vomiting-no specific findings on imaging-tolerating p.o. clear liquids I will graduate him to soft diet, start Carafate, Pepcid and see if this improves otherwise we will need GI input  Diarrhea-none since yesterday-monitor keep on contact precautions  Leukocytosis-monitor-as no diarrhea C. difficile is still pending-leukocytosis is down from 12.6-11 I wonder if he has a viral gastroenteritis given his history  Discussed with wife, early ambulation so no DVT prophylaxis, change to inpatient and monitorMahala Menghini, MD  [facility] Direct contact: [phone] --Via amion app OR  --[email or web address]; password [email or web address]  7PM-7AM contact night coverage as above 05/07/2018, 11:37 AM  LOS: 0 days   Consultants:  General surgery  Procedures:  n  Antimicrobials:  n  Interval history/Subjective: Awake alert pleasant no distress looks tired but otherwise fairly well No chest pain No fever No chills  Objective:  Vitals:  Vitals:   05/06/18 2123 05/07/18 0500  BP: 124/72 118/74  Pulse: 65 (!) 54  Resp: 20 20  Temp: 98 F (36.7 C) 97.9 F (36.6 C)  SpO2: 97% 100%    Exam:  Awake pleasant oriented Dry mucosa Abdomen soft S1-S2 no murmur No lower extremity edema Range of motion intact  I have personally reviewed the following:   Labs:  Calcium 8.5 AST 13 total protein 6.2 total bili 1.3  WBC 11.4  Ketones present in urine with small hemoglobin specific gravity elevated 1.0  Imaging studies:  CT and ultrasound  Medical tests:  n   Test discussed with performing  physician:  n  Decision to obtain old records:  n  Review and summation of old records:  n  Scheduled Meds: . enoxaparin (LOVENOX) injection  40 mg Subcutaneous Q24H  . pantoprazole  40 mg Oral Daily  . sucralfate  1 g Oral TID WC & HS   Continuous Infusions: . sodium chloride 100 mL/hr at 05/07/18 0559  . famotidine (PEPCID) IV      Active Problems:   Abdominal pain   Intractable nausea and vomiting   Leukocytosis   Diarrhea   Hypokalemia   Transient elevated blood pressure   LOS: 0 days

## 2018-05-07 NOTE — Progress Notes (Signed)
Subjective No acute events. Still with complaints of abdominal aches all over and back pain. No f/c. No n/v. Persistent diarrhea.  Objective: Vital signs in last 24 hours: Temp:  [97.9 F (36.6 C)-98.7 F (37.1 C)] 97.9 F (36.6 C) (10/23 0500) Pulse Rate:  [54-94] 54 (10/23 0500) Resp:  [20] 20 (10/23 0500) BP: (118-158)/(72-104) 118/74 (10/23 0500) SpO2:  [97 %-100 %] 100 % (10/23 0500) Weight:  [72.6 kg] 72.6 kg (10/22 1119) Last BM Date: 05/06/18  Intake/Output from previous day: 10/22 0701 - 10/23 0700 In: 1808.5 [P.O.:180; I.V.:628.5; IV Piggyback:1000] Out: 100 [Urine:100] Intake/Output this shift: No intake/output data recorded.  Gen: NAD, comfortable CV: RRR Pulm: Normal work of breathing Abd: Soft, overall nontender. He has distractible tenderness to palpation in all quadrants. No rebound. No guarding. Nondistended.  Ext: SCDs in place  Lab Results: CBC  Recent Labs    05/06/18 1150 05/07/18 0442  WBC 12.6* 11.4*  HGB 18.3* 14.5  HCT 54.8* 45.3  PLT 246 200   BMET Recent Labs    05/06/18 1150 05/07/18 0442  NA 139 139  K 3.2* 4.1  CL 98 105  CO2 26 27  GLUCOSE 107* 93  BUN 14 10  CREATININE 1.12 0.98  CALCIUM 10.3 8.5*   PT/INR No results for input(s): LABPROT, INR in the last 72 hours. ABG No results for input(s): PHART, HCO3 in the last 72 hours.  Invalid input(s): PCO2, PO2  Studies/Results:  Anti-infectives: Anti-infectives (From admission, onward)   None       Assessment/Plan: Patient Active Problem List   Diagnosis Date Noted  . Abdominal pain 05/06/2018  . Intractable nausea and vomiting 05/06/2018  . Leukocytosis 05/06/2018  . Diarrhea 05/06/2018  . Hypokalemia 05/06/2018  . Transient elevated blood pressure 05/06/2018    Tom Munoz is a pleasant 26yoM with hx of intractable GERD on PO prilosec, nephrolithiasis  - normal CT A/P aside from appendicolith which in the absence of appendicitis does not mandate a  procedure  -RUQ [place] normal -Afebrile; WBC 11.4 from 12.6 -Etiology remains unclear given normal CT and RUQ [place] - could be infectious, IBD, IBS, kidney stone -No planned surgical procedures at this juncture, would recommend further evaluation with gastroenterology -We remain available if additional questions or concerns arise   LOS: 0 days   Stephanie Coup. Cliffton Asters, M.D. [facility]

## 2018-05-08 DIAGNOSIS — K279 Peptic ulcer, site unspecified, unspecified as acute or chronic, without hemorrhage or perforation: Secondary | ICD-10-CM | POA: Diagnosis present

## 2018-05-08 DIAGNOSIS — R1011 Right upper quadrant pain: Secondary | ICD-10-CM

## 2018-05-08 MED ORDER — PANTOPRAZOLE SODIUM 40 MG PO TBEC
40.0000 mg | DELAYED_RELEASE_TABLET | Freq: Two times a day (BID) | ORAL | Status: DC
  Administered 2018-05-08 – 2018-05-09 (×3): 40 mg via ORAL
  Filled 2018-05-08 (×3): qty 1

## 2018-05-08 MED ORDER — SUCRALFATE 1 GM/10ML PO SUSP
1.0000 g | Freq: Three times a day (TID) | ORAL | Status: DC
  Administered 2018-05-08 – 2018-05-09 (×5): 1 g via ORAL
  Filled 2018-05-08 (×5): qty 10

## 2018-05-08 MED ORDER — LIDOCAINE VISCOUS HCL 2 % MT SOLN: 15.0000 mL | OROMUCOSAL | Status: DC | PRN

## 2018-05-08 NOTE — Progress Notes (Signed)
[facility] PROGRESS NOTE  Tyna Jaksch WUJ:[id number] DOB: 1990/10/22 DOA: 05/06/2018 PCP: Patient, No Pcp Per   Narrative: 27 year old Hispanic male with significant acid reflux Admitted with worsening nausea vomiting diarrhea-EGD 3 years ago started on Prilosec-has not taken meds in a long time with has not been able to get them   A & Plan Acute abdominal pain with nausea vomiting-no specific findings on imaging-some ptyalsim overnight -keep soft diet, adding viscous lidocaine to carafate [changed carafate to liquid], increase protonix to bid dosing, cont pepcid -labs am -if no better today with further vomit, add reglan and will ask GI to see re: possible esophagitis  Diarrhea-none since yesterday-monitor keep on contact precautions -since no diarr or stool since admit d/c precautiions  Leukocytosis-monitor-as no diarrhea C. difficile is still pending-leukocytosis is down from 12.6-11 I wonder if he has a viral gastroenteritis given his history  Discussed with patient, early ambulation so no DVT prophylaxis, change to inpatient and monitor    Mahala Menghini, MD  [facility] Direct contact: [phone] --Via amion app OR  --[email or web address]; password [email or web address]  7PM-7AM contact night coverage as above 05/08/2018, 8:43 AM  LOS: 0 days   Consultants:  General surgery  Procedures:  n  Antimicrobials:  n  Interval history/Subjective: Awake alert some discomofrt  No fever no chills Some vomit of mucus No cough no cold No stooo since admit  Objective:  Vitals:  Vitals:   05/08/18 0509 05/08/18 0514  BP: (!) 104/57 103/64  Pulse: (!) 52 (!) 59  Resp: 16 16  Temp: 98.1 F (36.7 C) 97.9 F (36.6 C)  SpO2: 100% 100%    Exam:  Sleepy awake cta b Tender in epigastrium and in RUQ abd soft, no rebound No le edema  I have personally reviewed the following:   Labs:   Imaging studies:  C  Medical tests:  n   Test discussed with performing  physician:  n  Decision to obtain old records:  n  Review and summation of old records:  n  Scheduled Meds: . enoxaparin (LOVENOX) injection  40 mg Subcutaneous Q24H  . pantoprazole  40 mg Oral Daily  . sucralfate  1 g Oral TID WC & HS   Continuous Infusions: . famotidine (PEPCID) IV 20 mg (05/07/18 2201)    Active Problems:   Abdominal pain   Intractable nausea and vomiting   Leukocytosis   Diarrhea   Hypokalemia   Transient elevated blood pressure   LOS: 0 days

## 2018-05-09 ENCOUNTER — Encounter: Payer: Self-pay | Admitting: Family Medicine

## 2018-05-09 DIAGNOSIS — R1084 Generalized abdominal pain: Secondary | ICD-10-CM

## 2018-05-09 LAB — CBC WITH DIFFERENTIAL/PLATELET
Abs Immature Granulocytes: 0.03 10*3/uL (ref 0.00–0.07)
Basophils Absolute: 0 10*3/uL (ref 0.0–0.1)
Basophils Relative: 0 %
Eosinophils Absolute: 0.2 10*3/uL (ref 0.0–0.5)
Eosinophils Relative: 2 %
HCT: 47.1 % (ref 39.0–52.0)
HEMOGLOBIN: 15.2 g/dL (ref 13.0–17.0)
Immature Granulocytes: 0 %
LYMPHS PCT: 34 %
Lymphs Abs: 2.8 10*3/uL (ref 0.7–4.0)
MCH: 29.5 pg (ref 26.0–34.0)
MCHC: 32.3 g/dL (ref 30.0–36.0)
MCV: 91.5 fL (ref 80.0–100.0)
MONO ABS: 0.7 10*3/uL (ref 0.1–1.0)
Monocytes Relative: 9 %
Neutro Abs: 4.4 10*3/uL (ref 1.7–7.7)
Neutrophils Relative %: 55 %
Platelets: 184 10*3/uL (ref 150–400)
RBC: 5.15 MIL/uL (ref 4.22–5.81)
RDW: 13 % (ref 11.5–15.5)
WBC: 8.2 10*3/uL (ref 4.0–10.5)
nRBC: 0 % (ref 0.0–0.2)

## 2018-05-09 LAB — BASIC METABOLIC PANEL
Anion gap: 11 (ref 5–15)
BUN: 10 mg/dL (ref 6–20)
CALCIUM: 8.6 mg/dL — AB (ref 8.9–10.3)
CO2: 27 mmol/L (ref 22–32)
CREATININE: 0.95 mg/dL (ref 0.61–1.24)
Chloride: 102 mmol/L (ref 98–111)
GFR calc Af Amer: >60 mL/min (ref >60)
GFR calc non Af Amer: >60 mL/min (ref >60)
Glucose, Bld: 97 mg/dL (ref 70–99)
Potassium: 3.1 mmol/L — ABNORMAL LOW (ref 3.5–5.1)
SODIUM: 140 mmol/L (ref 135–145)

## 2018-05-09 MED ORDER — LIDOCAINE VISCOUS HCL 2 % MT SOLN: 15.0000 mL | OROMUCOSAL | 0 refills | Status: AC | PRN

## 2018-05-09 MED ORDER — POTASSIUM CHLORIDE CRYS ER 20 MEQ PO TBCR: 40.0000 meq | EXTENDED_RELEASE_TABLET | Freq: Every day | ORAL | 0 refills | Status: AC

## 2018-05-09 MED ORDER — OMEPRAZOLE 40 MG PO CPDR: 40.0000 mg | DELAYED_RELEASE_CAPSULE | Freq: Every day | ORAL | 3 refills | Status: AC

## 2018-05-09 MED ORDER — POTASSIUM CHLORIDE CRYS ER 20 MEQ PO TBCR
40.0000 meq | EXTENDED_RELEASE_TABLET | Freq: Every day | ORAL | Status: DC
  Administered 2018-05-09: 40 meq via ORAL
  Filled 2018-05-09: qty 2

## 2018-05-09 MED ORDER — SUCRALFATE 1 GM/10ML PO SUSP: 1.0000 g | Freq: Three times a day (TID) | ORAL | 0 refills | Status: AC

## 2018-05-09 NOTE — Progress Notes (Signed)
Pt was discharged home today. Instructions were reviewed with patient, and questions were answered. Pt was taken to main entrance via wheelchair by NT.  

## 2018-05-09 NOTE — Discharge Summary (Signed)
Physician Discharge Summary  Girtha Rm PQD:[id number] DOB: 05-09-1991 DOA: 05/06/2018  PCP: Patient, No Pcp Per  Admit date: 05/06/2018 Discharge date: 05/09/2018  Time spent: 20 minutes  Recommendations for Outpatient Follow-up:  1. Recommend outpatient labs be met and CBC 1 week 2. Recommend acid reduction modalities as outpatient  Discharge Diagnoses:  Active Problems:   Abdominal pain   Intractable nausea and vomiting   Leukocytosis   Diarrhea   Hypokalemia   Transient elevated blood pressure   PUD (peptic ulcer disease)   RUQ pain   Discharge Condition:  good  Diet recommendation: soft  Filed Weights   05/06/18 1119  Weight: 72.6 kg    History of present illness:  27 year old Hispanic male with significant acid reflux Admitted with worsening nausea vomiting diarrhea-EGD 3 years ago started on Prilosec-has not taken meds in a long time with has not been able to get them  Hospital Course:  Acute abdominal pain with nausea vomiting-no specific findings on imaging-some ptyalsim overnight -keep soft diet, adding viscous lidocaine--on discharge doubled up Prilosec -Sometimes difficult to afford Carafate but still given prescription   Diarrhea-none since yesterday-monitor keep on contact precautions -since no diarr or stool since admit d/c precautiions  Leukocytosis-monitor-as no diarrhea C. difficile is still pending-leukocytosis is down from 12.6-11 I wonder if he has a viral gastroenteritis given his history  Discussed with patient, early ambulation so no DVT prophylaxis  Procedures:  none (i.e. Studies not automatically included, echos, thoracentesis, etc; not x-rays)  Consultations:  n  Discharge Exam: Vitals:   05/08/18 2129 05/09/18 0625  BP: 135/87 92/77  Pulse: 72 (!) 54  Resp: 18 18  Temp: 98.4 F (36.9 C) 97.9 F (36.6 C)  SpO2: 98% 100%    General: Awake alert pleasant no distress Cardiovascular: S1-S2 no murmur rub or  gallop Respiratory: Clinically clear no added sound Abdomen soft nontender nondistended no rebound no guarding Neurologically intact  Discharge Instructions   Discharge Instructions    Diet - low sodium heart healthy   Complete by:  As directed    Discharge instructions   Complete by:  As directed    Recommend using viscous lidocaine for 2 to 3 days and then discontinuation of this-would double up your Prilosec medication for at least 1 month I will provide you with a prescription and then you can go to one time a day Carafate is a reasonable option and I will prescribe it however if it is too expensive just use lidocaine as above Your potassium was a little low so you will need some potassium replacement as an outpatient   Increase activity slowly   Complete by:  As directed      Allergies as of 05/09/2018   No Known Allergies     Medication List    TAKE these medications   lidocaine 2 % solution Commonly known as:  XYLOCAINE Use as directed 15 mLs in the mouth or throat every 4 (four) hours as needed for mouth pain.   omeprazole 40 MG capsule Commonly known as:  PRILOSEC Take 1 capsule (40 mg total) by mouth daily.   potassium chloride SA 20 MEQ tablet Commonly known as:  K-DUR,KLOR-CON Take 2 tablets (40 mEq total) by mouth daily. Start taking on:  05/10/2018      No Known Allergies    The results of significant diagnostics from this hospitalization (including imaging, microbiology, ancillary and laboratory) are listed below for reference.    Significant Diagnostic Studies: Ct Abdomen  Pelvis W Contrast  Result Date: 05/06/2018 CLINICAL DATA:  Right-sided abdominal pain with nausea and vomiting for 6 days EXAM: CT ABDOMEN AND PELVIS WITH CONTRAST TECHNIQUE: Multidetector CT imaging of the abdomen and pelvis was performed using the standard protocol following bolus administration of intravenous contrast. CONTRAST:  164m ISOVUE-300 IOPAMIDOL (ISOVUE-300) INJECTION  61% COMPARISON:  None. FINDINGS: Lower chest: Lung bases are clear. Hepatobiliary: No focal liver lesions are evident. Gallbladder wall is not appreciably thickened. There is no biliary duct dilatation. Pancreas: There is no pancreatic mass or inflammatory focus. Spleen: No splenic lesions are evident. Adrenals/Urinary Tract: Adrenals bilaterally appear unremarkable. Kidneys bilaterally show no evident mass or hydronephrosis on either side. There is no appreciable renal or ureteral calculus on either side. Urinary bladder is midline with wall thickness within normal limits. Stomach/Bowel: There is no appreciable bowel wall or mesenteric thickening. There is no evident bowel obstruction. No free air or portal venous air. Vascular/Lymphatic: No abdominal aortic aneurysm. No vascular lesions are evident. There is no appreciable adenopathy in the abdomen or pelvis. Reproductive: Prostate and seminal vesicles appear normal in size and contour. No pelvic mass evident. Other: There are calcifications within the appendix, likely appendicoliths. There is no appendiceal distention or wall thickening. There is no periappendiceal region inflammation. There is no abscess in the abdomen or pelvis. Musculoskeletal: There are no blastic or lytic bone lesions. There is no abdominal wall or intramuscular lesion. IMPRESSION: 1. Presumed appendicoliths within the appendix. Appendix otherwise appears normal without demonstrable appendiceal region inflammation. The surrounding mesentery in this area appears normal, and there is no abnormal fluid in this area. 2. No bowel wall thickening or bowel obstruction evident. No abscess in the abdomen or pelvis. 3.  No evident renal or ureteral calculus.  No hydronephrosis. Electronically Signed   By: WLowella GripIII M.D.   On: 05/06/2018 13:37   [place]Abdomen Limited Ruq  Result Date: 05/06/2018 CLINICAL DATA:  Right upper quadrant abdominal pain EXAM: ULTRASOUND ABDOMEN LIMITED RIGHT  UPPER QUADRANT COMPARISON:  05/06/2018 CT abdomen/pelvis. FINDINGS: Gallbladder: No gallstones or wall thickening visualized. No sonographic Murphy sign noted by sonographer. Common bile duct: Diameter: 3 mm Liver: No focal lesion identified. Within normal limits in parenchymal echogenicity. Portal vein is patent on color Doppler imaging with normal direction of blood flow towards the liver. IMPRESSION: Normal right upper quadrant abdominal sonogram.  No cholelithiasis. Electronically Signed   By: JIlona SorrelM.D.   On: 05/06/2018 18:29    Microbiology: No results found for this or any previous visit (from the past 240 hour(s)).   Labs: Basic Metabolic Panel: Recent Labs  Lab 05/06/18 1150 05/07/18 0442 05/09/18 0355  NA 139 139 140  K 3.2* 4.1 3.1*  CL 98 105 102  CO2 _0 GLUCOSE 107* 93 97  BUN _1 CREATININE 1.12 0.98 0.95  CALCIUM 10.3 8.5* 8.6*   Liver Function Tests: Recent Labs  Lab 05/06/18 1150 05/07/18 0442  AST 23 13*  ALT 23 15  ALKPHOS 104 68  BILITOT 1.1 1.3*  PROT 9.0* 6.2*  ALBUMIN 5.5* 3.6   Recent Labs  Lab 05/06/18 1150  LIPASE 25   No results for input(s): AMMONIA in the last 168 hours. CBC: Recent Labs  Lab 05/06/18 1150 05/07/18 0442 05/09/18 0355  WBC 12.6* 11.4* 8.2  NEUTROABS  --   --  4.4  HGB 18.3* 14.5 15.2  HCT 54.8* 45.3 47.1  MCV 89.7 92.6 91.5  PLT 246 200 184   Cardiac Enzymes: No results for input(s): CKTOTAL, CKMB, CKMBINDEX, TROPONINI in the last 168 hours. BNP: BNP (last 3 results) No results for input(s): BNP in the last 8760 hours.  ProBNP (last 3 results) No results for input(s): PROBNP in the last 8760 hours.  CBG: No results for input(s): GLUCAP in the last 168 hours.     Signed:  Nita Sells MD   [facility] 05/09/2018, 10:45 AM

## 2018-06-19 ENCOUNTER — Emergency Department (INDEPENDENT_AMBULATORY_CARE_PROVIDER_SITE_OTHER)
Admission: EM | Admit: 2018-06-19 | Discharge: 2018-06-19 | Disposition: A | Discharge status: Home / Self Care | Payer: 59 | Source: Home / Self Care

## 2018-06-19 ENCOUNTER — Other Ambulatory Visit: Payer: Self-pay

## 2018-06-19 DIAGNOSIS — K219 Gastro-esophageal reflux disease without esophagitis: Secondary | ICD-10-CM

## 2018-06-19 DIAGNOSIS — R110 Nausea: Secondary | ICD-10-CM

## 2018-06-19 MED ORDER — SUCRALFATE 1 GM/10ML PO SUSP: 1.0000 g | Freq: Three times a day (TID) | ORAL | 5 refills | Status: AC

## 2018-06-19 NOTE — ED Triage Notes (Signed)
Pt reports he is out of her carafate Rx and woke up nauseous this morning. States he suffers from acid reflux and has been out of his medication. Does establish with new PCP this week.

## 2018-06-19 NOTE — ED Provider Notes (Signed)
[facility]    CSN: [id number] Arrival date & time: 06/19/18  1318     History   Chief Complaint Chief Complaint  Patient presents with  . Nausea    HPI Tom Munoz Tom Munoz is a 27 y.o. male.   This is a 27 year old man who works for [employer] and has a history of GERD.  He has had multiple admissions for nausea and vomiting and dehydration.  He is found that sucralfate sustains and normal lifestyle.  He has an appointment with gastroenterology next week.  He ran out of his sucralfate today and nausea is starting to come back.  He has had no vomiting, abdominal pain, diarrhea, blood in stool, fever.  Patient does not drink, smoke, or take caffeine drinks to excess.     Past Medical History:  Diagnosis Date  . Cannabinoid hyperemesis syndrome (HCC)   . Chronic pain syndrome   . Cyclic vomiting syndrome   . Drug-seeking behavior   . Gastroparesis   . GERD (gastroesophageal reflux disease)     There are no active problems to display for this patient.   Past Surgical History:  Procedure Laterality Date  . APPENDECTOMY    . APPENDECTOMY  2012       Home Medications    Prior to Admission medications   Medication Sig Start Date End Date Taking? Authorizing Provider  sucralfate (CARAFATE) 1 GM/10ML suspension Take 1 g by mouth 4 (four) times daily -  with meals and at bedtime.   Yes [provider]  sucralfate (CARAFATE) 1 GM/10ML suspension Take 10 mLs (1 g total) by mouth 4 (four) times daily -  with meals and at bedtime. 06/19/18   Elvina Sidle, MD    Family History No family history on file.  Social History Social History   Tobacco Use  . Smoking status: Former Games developer  . Smokeless tobacco: Never Used  Substance Use Topics  . Alcohol use: No  . Drug use: Yes    Types: Marijuana    Comment: states last use was January 2017     Allergies   Bentyl [dicyclomine] and Reglan [metoclopramide]   Review of  Systems Review of Systems  Gastrointestinal: Positive for nausea.  All other systems reviewed and are negative.    Physical Exam Triage Vital Signs ED Triage Vitals [06/19/18 1332]  Enc Vitals Group     BP 130/73     Pulse Rate 89     Resp      Temp 98.6 F (37 C)     Temp Source Oral     SpO2 98 %     Weight 163 lb (73.9 kg)     Height 5\' 6"  (1.676 m)     Head Circumference      Peak Flow      Pain Score 0     Pain Loc      Pain Edu?      Excl. in GC?    No data found.  Updated Vital Signs BP 130/73 (BP Location: Left Arm)   Pulse 89   Temp 98.6 F (37 C) (Oral)   Ht 5\' 6"  (1.676 m)   Wt 73.9 kg   SpO2 98%   BMI 26.31 kg/m    Physical Exam  Constitutional: He is oriented to person, place, and time. He appears well-developed and well-nourished.  HENT:  Right Ear: External ear normal.  Left Ear: External ear normal.  Mouth/Throat: Oropharynx is clear and moist.  Eyes: Conjunctivae are normal.  Neck: Normal range of motion. Neck supple.  Cardiovascular: Normal rate and regular rhythm.  Pulmonary/Chest: Effort normal and breath sounds normal.  Abdominal: Soft. Bowel sounds are normal. There is no tenderness.  Musculoskeletal: Normal range of motion.  Neurological: He is alert and oriented to person, place, and time.  Skin: Skin is warm and dry.  Nursing note and vitals reviewed.    UC Treatments / Results  Labs (all labs ordered are listed, but only abnormal results are displayed) Labs Reviewed - No data to display  EKG None  Radiology No results found.  Procedures Procedures (including critical care time)  Medications Ordered in UC Medications - No data to display  Initial Impression / Assessment and Plan / UC Course  I have reviewed the triage vital signs and the nursing notes.  Pertinent labs & imaging results that were available during my care of the patient were reviewed by me and considered in my medical decision making (see chart for  details).    Final Clinical Impressions(s) / UC Diagnoses   Final diagnoses:  Nausea  Gastroesophageal reflux disease without esophagitis     Discharge Instructions     Keep your follow up appointment with gastroenterology next week.    ED Prescriptions    Medication Sig Dispense Auth. Provider   sucralfate (CARAFATE) 1 GM/10ML suspension Take 10 mLs (1 g total) by mouth 4 (four) times daily -  with meals and at bedtime. 420 mL Elvina SidleLauenstein, Xzandria Clevinger, MD     Controlled Substance Prescriptions [place] Controlled Substance Registry consulted? Not Applicable   Elvina SidleLauenstein, Faydra Korman, MD 06/19/18 1339

## 2018-06-19 NOTE — Discharge Instructions (Addendum)
Keep your follow up appointment with gastroenterology next week.

## 2018-06-20 ENCOUNTER — Encounter (HOSPITAL_COMMUNITY): Payer: Self-pay | Admitting: Emergency Medicine

## 2018-08-03 ENCOUNTER — Emergency Department (HOSPITAL_COMMUNITY)
Admission: EM | Admit: 2018-08-03 | Discharge: 2018-08-04 | Disposition: A | Discharge status: Home / Self Care | Payer: PRIVATE HEALTH INSURANCE | Attending: Emergency Medicine | Admitting: Emergency Medicine

## 2018-08-03 ENCOUNTER — Encounter (HOSPITAL_COMMUNITY): Payer: Self-pay

## 2018-08-03 ENCOUNTER — Other Ambulatory Visit: Payer: Self-pay

## 2018-08-03 DIAGNOSIS — R1115 Cyclical vomiting syndrome unrelated to migraine: Secondary | ICD-10-CM | POA: Insufficient documentation

## 2018-08-03 DIAGNOSIS — E860 Dehydration: Secondary | ICD-10-CM | POA: Insufficient documentation

## 2018-08-03 DIAGNOSIS — R064 Hyperventilation: Secondary | ICD-10-CM | POA: Insufficient documentation

## 2018-08-03 DIAGNOSIS — Z79899 Other long term (current) drug therapy: Secondary | ICD-10-CM | POA: Insufficient documentation

## 2018-08-03 DIAGNOSIS — R1011 Right upper quadrant pain: Secondary | ICD-10-CM | POA: Insufficient documentation

## 2018-08-03 LAB — COMPREHENSIVE METABOLIC PANEL
ALT: 27 U/L (ref 0–44)
AST: 31 U/L (ref 15–41)
Albumin: 5.8 g/dL — ABNORMAL HIGH (ref 3.5–5.0)
Alkaline Phosphatase: 109 U/L (ref 38–126)
Anion gap: 16 — ABNORMAL HIGH (ref 5–15)
BUN: 14 mg/dL (ref 6–20)
CHLORIDE: 106 mmol/L (ref 98–111)
CO2: 21 mmol/L — ABNORMAL LOW (ref 22–32)
Calcium: 10.5 mg/dL — ABNORMAL HIGH (ref 8.9–10.3)
Creatinine, Ser: 1.11 mg/dL (ref 0.61–1.24)
GFR calc Af Amer: >60 mL/min (ref >60)
Glucose, Bld: 132 mg/dL — ABNORMAL HIGH (ref 70–99)
Potassium: 3.5 mmol/L (ref 3.5–5.1)
Sodium: 143 mmol/L (ref 135–145)
TOTAL PROTEIN: 9.1 g/dL — AB (ref 6.5–8.1)
Total Bilirubin: 1.2 mg/dL (ref 0.3–1.2)

## 2018-08-03 LAB — CBC WITH DIFFERENTIAL/PLATELET
ABS IMMATURE GRANULOCYTES: 0.05 10*3/uL (ref 0.00–0.07)
BASOS ABS: 0 10*3/uL (ref 0.0–0.1)
BASOS PCT: 0 %
Eosinophils Absolute: 0 10*3/uL (ref 0.0–0.5)
Eosinophils Relative: 0 %
HCT: 52.4 % — ABNORMAL HIGH (ref 39.0–52.0)
Hemoglobin: 17.5 g/dL — ABNORMAL HIGH (ref 13.0–17.0)
IMMATURE GRANULOCYTES: 0 %
Lymphocytes Relative: 5 %
Lymphs Abs: 0.6 10*3/uL — ABNORMAL LOW (ref 0.7–4.0)
MCH: 29.9 pg (ref 26.0–34.0)
MCHC: 33.4 g/dL (ref 30.0–36.0)
MCV: 89.4 fL (ref 80.0–100.0)
MONOS PCT: 3 %
Monocytes Absolute: 0.4 10*3/uL (ref 0.1–1.0)
NEUTROS ABS: 11.1 10*3/uL — AB (ref 1.7–7.7)
NEUTROS PCT: 92 %
NRBC: 0 % (ref 0.0–0.2)
PLATELETS: 218 10*3/uL (ref 150–400)
RBC: 5.86 MIL/uL — AB (ref 4.22–5.81)
RDW: 13.3 % (ref 11.5–15.5)
WBC: 12.1 10*3/uL — AB (ref 4.0–10.5)

## 2018-08-03 LAB — LIPASE, BLOOD: LIPASE: 26 U/L (ref 11–51)

## 2018-08-03 MED ORDER — LORAZEPAM 2 MG/ML IJ SOLN
1.0000 mg | Freq: Once | INTRAMUSCULAR | Status: AC
  Administered 2018-08-03: 1 mg via INTRAVENOUS
  Filled 2018-08-03: qty 1

## 2018-08-03 MED ORDER — ALUM & MAG HYDROXIDE-SIMETH 200-200-20 MG/5ML PO SUSP
30.0000 mL | Freq: Once | ORAL | Status: AC
  Administered 2018-08-03: 30 mL via ORAL
  Filled 2018-08-03: qty 30

## 2018-08-03 MED ORDER — KETOROLAC TROMETHAMINE 30 MG/ML IJ SOLN
15.0000 mg | Freq: Once | INTRAMUSCULAR | Status: AC
  Administered 2018-08-03: 15 mg via INTRAVENOUS
  Filled 2018-08-03: qty 1

## 2018-08-03 MED ORDER — FAMOTIDINE IN NACL 20-0.9 MG/50ML-% IV SOLN
20.0000 mg | Freq: Once | INTRAVENOUS | Status: AC
  Administered 2018-08-03: 20 mg via INTRAVENOUS
  Filled 2018-08-03: qty 50

## 2018-08-03 MED ORDER — LIDOCAINE VISCOUS HCL 2 % MT SOLN
15.0000 mL | Freq: Once | OROMUCOSAL | Status: AC
  Administered 2018-08-03: 15 mL via ORAL
  Filled 2018-08-03: qty 15

## 2018-08-03 MED ORDER — SODIUM CHLORIDE 0.9% FLUSH: 3.0000 mL | Freq: Once | INTRAVENOUS | Status: DC

## 2018-08-03 MED ORDER — PROMETHAZINE HCL 25 MG/ML IJ SOLN
25.0000 mg | Freq: Once | INTRAMUSCULAR | Status: AC
  Administered 2018-08-03: 25 mg via INTRAVENOUS
  Filled 2018-08-03: qty 1

## 2018-08-03 MED ORDER — SUCRALFATE 1 GM/10ML PO SUSP
1.0000 g | Freq: Once | ORAL | Status: AC
  Administered 2018-08-03: 1 g via ORAL
  Filled 2018-08-03: qty 10

## 2018-08-03 MED ORDER — SODIUM CHLORIDE 0.9 % IV BOLUS
1000.0000 mL | Freq: Once | INTRAVENOUS | Status: AC
  Administered 2018-08-03: 1000 mL via INTRAVENOUS

## 2018-08-03 NOTE — ED Provider Notes (Signed)
[facility]-EMERGENCY DEPT Provider Note   CSN: [id number] Arrival date & time: 08/03/18  1959     History   Chief Complaint Chief Complaint  Patient presents with  . Abdominal Pain    HPI Tom Munoz is a 28 y.o. male with a PMHx of GERD, cyclic vomiting syndrome, gastroparesis, PUD, drug-seeking behaviors, chronic abd pain, and other conditions listed below, with PSHx of appendectomy, who presents to the ED with complaints of right upper quadrant abdominal pain that began 2 days ago with associated nausea and vomiting.  He describes his pain as 9/10 constant crampy nonradiating RUQ pain that worsens with sitting up and has been unrelieved with Tylenol and ibuprofen.  He reports associated nausea and to numerous to count episodes of nonbloody nonbilious emesis.  He endorses marijuana use 3 weeks ago but none since then.  He has tried Zofran at home without relief.  He denies fevers, chills, CP, SOB, diarrhea/constipation, obstipation, melena, hematochezia, hematemesis, hematuria, dysuria, myalgias, arthralgias, numbness, tingling, focal weakness, or any other complaints at this time. Denies recent travel, sick contacts, suspicious food intake, EtOH use, or frequent NSAID use.  Of note, pt was admitted in 04/2018 for similar symptoms, had RUQ U/S which showed no cholelithiasis or other acute findings.  He also had a RUQ U/S at [facility] [place] on 07/04/2018 which was also unremarkable.   The history is provided by the patient and medical records. No language interpreter was used.  Abdominal Pain  Associated symptoms: nausea and vomiting   Associated symptoms: no chest pain, no chills, no constipation, no diarrhea, no dysuria, no fever, no hematuria and no shortness of breath     Past Medical History:  Diagnosis Date  . Acid reflux   . Cannabinoid hyperemesis syndrome (HCC)   . Chronic pain syndrome   . Cyclic vomiting syndrome   . Drug-seeking  behavior   . Gastroparesis   . GERD (gastroesophageal reflux disease)     Patient Active Problem List   Diagnosis Date Noted  . PUD (peptic ulcer disease) 05/08/2018  . RUQ pain   . Abdominal pain 05/06/2018  . Intractable nausea and vomiting 05/06/2018  . Leukocytosis 05/06/2018  . Diarrhea 05/06/2018  . Hypokalemia 05/06/2018  . Transient elevated blood pressure 05/06/2018    Past Surgical History:  Procedure Laterality Date  . APPENDECTOMY    . APPENDECTOMY  2012        Home Medications    Prior to Admission medications   Medication Sig Start Date End Date Taking? Authorizing Provider  lidocaine (XYLOCAINE) 2 % solution Use as directed 15 mLs in the mouth or throat every 4 (four) hours as needed for mouth pain. 05/09/18   Rhetta Mura, MD  omeprazole (PRILOSEC) 40 MG capsule Take 1 capsule (40 mg total) by mouth daily. 05/09/18   Rhetta Mura, MD  potassium chloride SA (K-DUR,KLOR-CON) 20 MEQ tablet Take 2 tablets (40 mEq total) by mouth daily. 05/10/18   Rhetta Mura, MD  sucralfate (CARAFATE) 1 GM/10ML suspension Take 1 g by mouth 4 (four) times daily -  with meals and at bedtime.    [provider]  sucralfate (CARAFATE) 1 GM/10ML suspension Take 10 mLs (1 g total) by mouth 4 (four) times daily -  with meals and at bedtime. 06/19/18   Elvina Sidle, MD  sucralfate (CARAFATE) 1 GM/10ML suspension Take 10 mLs (1 g total) by mouth 4 (four) times daily -  with meals and at bedtime for  7 days. 05/09/18 05/16/18  Rhetta MuraSamtani, Jai-Gurmukh, MD    Family History Family History  Problem Relation Age of Onset  . Gallbladder disease Mother     Social History Social History   Tobacco Use  . Smoking status: Never Smoker  . Smokeless tobacco: Never Used  Substance Use Topics  . Alcohol use: Never    Frequency: Never  . Drug use: Yes    Types: Marijuana    Comment: states last use was January 2017     Allergies   Bentyl [dicyclomine] and  Reglan [metoclopramide]   Review of Systems Review of Systems  Constitutional: Negative for chills and fever.  Respiratory: Negative for shortness of breath.   Cardiovascular: Negative for chest pain.  Gastrointestinal: Positive for abdominal pain, nausea and vomiting. Negative for blood in stool, constipation and diarrhea.  Genitourinary: Negative for dysuria and hematuria.  Musculoskeletal: Negative for arthralgias and myalgias.  Skin: Negative for color change.  Allergic/Immunologic: Negative for immunocompromised state.  Neurological: Negative for weakness and numbness.  Psychiatric/Behavioral: Negative for confusion.   All other systems reviewed and are negative for acute change except as noted in the HPI.    Physical Exam Updated Vital Signs BP (!) 142/74   Pulse 60   Temp 98.6 F (37 C) (Oral)   Resp 20   Ht 5\' 8"  (1.727 m)   Wt 77.1 kg   SpO2 100%   BMI 25.85 kg/m   Physical Exam Vitals signs and nursing note reviewed.  Constitutional:      General: He is not in acute distress.    Appearance: Normal appearance. He is well-developed. He is not toxic-appearing.     Comments: Afebrile, nontoxic, hyperventilating and very dramatic in his demeanor but not ill appearing.   HENT:     Head: Normocephalic and atraumatic.     Mouth/Throat:     Mouth: Mucous membranes are dry.  Eyes:     General:        Right eye: No discharge.        Left eye: No discharge.     Conjunctiva/sclera: Conjunctivae normal.  Neck:     Musculoskeletal: Normal range of motion and neck supple.  Cardiovascular:     Rate and Rhythm: Normal rate and regular rhythm.     Pulses: Normal pulses.     Heart sounds: Normal heart sounds, S1 normal and S2 normal. No murmur. No friction rub. No gallop.   Pulmonary:     Effort: Pulmonary effort is normal. No respiratory distress.     Breath sounds: Normal breath sounds. No decreased breath sounds, wheezing, rhonchi or rales.  Abdominal:     General:  Bowel sounds are normal. There is no distension.     Palpations: Abdomen is soft. Abdomen is not rigid.     Tenderness: There is abdominal tenderness in the right upper quadrant and epigastric area. There is no right CVA tenderness, left CVA tenderness, guarding or rebound. Negative signs include Murphy's sign and McBurney's sign.       Comments: Soft, nondistended, +BS throughout, diffuse tenderness to even minimal palpation over the R lateral abdomen and RUQ/epigastric area, seems to change with distraction; no r/g/r, neg murphy's sign, neg mcburney's, no CVA TTP   Musculoskeletal: Normal range of motion.  Skin:    General: Skin is warm and dry.     Findings: No rash.  Neurological:     Mental Status: He is alert and oriented to person, place, and time.  Sensory: Sensation is intact. No sensory deficit.     Motor: Motor function is intact.  Psychiatric:        Mood and Affect: Mood and affect normal.        Behavior: Behavior normal.      ED Treatments / Results  Labs (all labs ordered are listed, but only abnormal results are displayed) Labs Reviewed  COMPREHENSIVE METABOLIC PANEL - Abnormal; Notable for the following components:      Result Value   CO2 21 (*)    Glucose, Bld 132 (*)    Calcium 10.5 (*)    Total Protein 9.1 (*)    Albumin 5.8 (*)    Anion gap 16 (*)    All other components within normal limits  CBC WITH DIFFERENTIAL/PLATELET - Abnormal; Notable for the following components:   WBC 12.1 (*)    RBC 5.86 (*)    Hemoglobin 17.5 (*)    HCT 52.4 (*)    Neutro Abs 11.1 (*)    Lymphs Abs 0.6 (*)    All other components within normal limits  LIPASE, BLOOD  URINALYSIS, ROUTINE W REFLEX MICROSCOPIC    EKG None  Radiology No results found.   RUQ U/S 05/06/18: Study Result  CLINICAL DATA:  Right upper quadrant abdominal pain  EXAM: ULTRASOUND ABDOMEN LIMITED RIGHT UPPER QUADRANT  COMPARISON:  05/06/2018 CT  abdomen/pelvis.  FINDINGS: Gallbladder:  No gallstones or wall thickening visualized. No sonographic Murphy sign noted by sonographer.  Common bile duct:  Diameter: 3 mm  Liver:  No focal lesion identified. Within normal limits in parenchymal echogenicity. Portal vein is patent on color Doppler imaging with normal direction of blood flow towards the liver.  IMPRESSION: Normal right upper quadrant abdominal sonogram.  No cholelithiasis.   Electronically Signed   By: Delbert Phenix M.D.   On: 05/06/2018 18:29    RUQ U/S 07/04/18 at [facility]: Result Impression  IMPRESSION:  1. No evidence for cholelithiasis. 2. Negative for acute process.  Electronically Signed by: Lambert Keto  Result Narrative  COMPARISON: 12/19/2017.  INDICATION: Right Upper Quadrant Pain  TECHNIQUE: RIGHT UPPER QUADRANT ABDOMINAL ULTRASOUND  FINDINGS:  Aorta: Not visualized  Inferior vena cava: Not visualized  Pancreas: Not visualized  Liver: Unremarkable echogenicity. No suspicious hepatic mass. Normal size.Marland Kitchen  Portal vein: Flow is hepatopedal.  Gallbladder: Unremarkable. Gallbladder wall thickness 3.4 mm. Negative pericholecystic fluid. Negative sonographic Murphy's sign.  Biliary ducts: Common Bile Duct 3.2 mm.  Right kidney: No urinary calculus or hydronephrosis. 10.9 cm  Other: NA     Procedures Procedures (including critical care time)  Medications Ordered in ED Medications  sodium chloride flush (NS) 0.9 % injection 3 mL (3 mLs Intravenous Not Given 08/03/18 2104)  LORazepam (ATIVAN) injection 1 mg (1 mg Intravenous Given 08/03/18 2148)  sodium chloride 0.9 % bolus 1,000 mL (1,000 mLs Intravenous New Bag/Given 08/03/18 2102)  promethazine (PHENERGAN) injection 25 mg (25 mg Intravenous Given 08/03/18 2100)  famotidine (PEPCID) IVPB 20 mg premix (0 mg Intravenous Stopped 08/03/18 2133)  ketorolac (TORADOL) 30 MG/ML injection 15 mg (15 mg Intravenous Given 08/03/18  2059)  sodium chloride 0.9 % bolus 1,000 mL (1,000 mLs Intravenous New Bag/Given 08/03/18 2148)  sucralfate (CARAFATE) 1 GM/10ML suspension 1 g (1 g Oral Given 08/03/18 2242)  alum & mag hydroxide-simeth (MAALOX/MYLANTA) 200-200-20 MG/5ML suspension 30 mL (30 mLs Oral Given 08/03/18 2242)    And  lidocaine (XYLOCAINE) 2 % viscous mouth solution 15 mL (15 mLs Oral Given  08/03/18 2242)     Initial Impression / Assessment and Plan / ED Course  I have reviewed the triage vital signs and the nursing notes.  Pertinent labs & imaging results that were available during my care of the patient were reviewed by me and considered in my medical decision making (see chart for details).     28 y.o. male here with right upper quadrant abdominal pain that began 2 days ago.  Patient has a history of recurrent upper abdominal pain, has had cyclic vomiting syndrome admissions before, had a right upper quadrant ultrasound back on 05/06/2018 as well [facility],  both times they were unremarkable.  On exam, lips dry, diffuse tenderness to the right lateral abdomen and RUQ/epigastric area with even minimal palpation but seems to change with distraction, patient hyperventilating and very dramatic in his demeanor.  Negative Murphy's sign, non-peritoneal.  Suspect cyclic vomiting and gastritis/chronic abdominal pain.  Doubt need for imaging at this time, will await lab work, if labs reveal abnormal LFTs then may consider right upper quadrant ultrasound at that time.  Will give Ativan, Phenergan, Pepcid, Toradol, fluids, and reassess.  9:46 PM CBC w/diff with mild leukocytosis 12.1 but hemoconcentrated and likely also some component of stress demargination. CMP with marginally elevated anion gap 16, bicarb 21 likely all from dehydration, will order more fluids. Lipase WNL. U/A not yet done. Pt did not receive ativan yet because he refused it at first, now accepting of it. Will reassess once this is given.    10:38 PM Pt asleep, when awoken he states he still has pain but his nausea is better; he quickly falls back to sleep after stating he's hurting; he appears much more comfortable at this time. Will give carafate and GI cocktail. Still awaiting U/A. Will continue to monitor and reassess shortly.   11:02 PM U/A pending. Patient care to be resumed by Elpidio AnisShari Upstill, PA-C at shift change sign-out. Patient history has been discussed with provider resuming care. Please see their notes for further documentation of pending results and dispo/care. Pt stable at sign-out and updated on transfer of care.    Final Clinical Impressions(s) / ED Diagnoses   Final diagnoses:  Cyclic vomiting syndrome  RUQ abdominal pain  Dehydration    ED Discharge Orders    [address]None       Lenville Hibberd, [place], [place] JerseyPA-C 08/03/18 2302    Azalia Bilisampos, Kevin, MD 08/03/18 [phone]

## 2018-08-03 NOTE — ED Provider Notes (Signed)
Chronic AP, cyclic vomiting Ativan, carafate, phenergan, toradol, GI Cocktail with some relief  Plan: review UA Assess after PO challenge Anticipate d/ch home  12:40 - recheck - patient is resting. He has been reluctant to take any PO challenge but reports nausea and pain are improved. Asked him to try to take a small amount of PO fluids.   1:45 - Patient is tolerating PO's without further vomiting. Discussed discharge home. He is comfortable with discharge home but request a final dose of medication prior to discharge. Ativan and phenergan ordered. Will discharge home per plan of previous treatment team.    Elpidio Anis, PA-C 08/04/18 0154    Azalia Bilis, MD 08/05/18 2155

## 2018-08-03 NOTE — ED Triage Notes (Signed)
States for 2 days abdominal pain with hx of gastroparesis with nausea and vomiting.

## 2018-08-03 NOTE — ED Notes (Signed)
Attempted lab draw but unsuccessful, pt. In pain.

## 2018-08-03 NOTE — ED Notes (Signed)
Pt reports that he is unable to urinate at this time.

## 2018-08-04 LAB — URINALYSIS, ROUTINE W REFLEX MICROSCOPIC
BACTERIA UA: NONE SEEN
Bilirubin Urine: NEGATIVE
Glucose, UA: NEGATIVE mg/dL
Ketones, ur: 80 mg/dL — AB
Leukocytes, UA: NEGATIVE
NITRITE: NEGATIVE
PROTEIN: NEGATIVE mg/dL
SPECIFIC GRAVITY, URINE: 1.02 (ref 1.005–1.030)
pH: 7 (ref 5.0–8.0)

## 2018-08-04 MED ORDER — PROMETHAZINE HCL 25 MG/ML IJ SOLN
12.5000 mg | Freq: Once | INTRAMUSCULAR | Status: AC
  Administered 2018-08-04: 12.5 mg via INTRAVENOUS
  Filled 2018-08-04: qty 1

## 2018-08-04 MED ORDER — PROMETHAZINE HCL 25 MG PO TABS: 25.0000 mg | ORAL_TABLET | Freq: Four times a day (QID) | ORAL | 0 refills | Status: AC | PRN

## 2018-08-04 MED ORDER — LORAZEPAM 2 MG/ML IJ SOLN
1.0000 mg | Freq: Once | INTRAMUSCULAR | Status: AC
  Administered 2018-08-04: 1 mg via INTRAVENOUS
  Filled 2018-08-04: qty 1

## 2018-08-04 MED ORDER — TRAMADOL HCL 50 MG PO TABS: 50.0000 mg | ORAL_TABLET | Freq: Four times a day (QID) | ORAL | 0 refills | Status: AC | PRN

## 2018-08-04 NOTE — Discharge Instructions (Signed)
Follow up with your gastroenterologist by calling the office in the morning to schedule a recheck appointment. Return to the ED with any severe pain, high fever, uncontrolled vomiting or new concern.

## 2019-04-14 ENCOUNTER — Encounter (HOSPITAL_BASED_OUTPATIENT_CLINIC_OR_DEPARTMENT_OTHER): Payer: Self-pay

## 2019-04-14 ENCOUNTER — Emergency Department (HOSPITAL_BASED_OUTPATIENT_CLINIC_OR_DEPARTMENT_OTHER)
Admission: EM | Admit: 2019-04-14 | Discharge: 2019-04-14 | Disposition: A | Discharge status: Home / Self Care | Payer: Self-pay | Attending: Emergency Medicine | Admitting: Emergency Medicine

## 2019-04-14 ENCOUNTER — Other Ambulatory Visit: Payer: Self-pay

## 2019-04-14 ENCOUNTER — Emergency Department (HOSPITAL_BASED_OUTPATIENT_CLINIC_OR_DEPARTMENT_OTHER): Payer: Self-pay

## 2019-04-14 DIAGNOSIS — N200 Calculus of kidney: Secondary | ICD-10-CM

## 2019-04-14 DIAGNOSIS — N132 Hydronephrosis with renal and ureteral calculous obstruction: Secondary | ICD-10-CM | POA: Insufficient documentation

## 2019-04-14 DIAGNOSIS — Z79899 Other long term (current) drug therapy: Secondary | ICD-10-CM | POA: Insufficient documentation

## 2019-04-14 LAB — URINALYSIS, ROUTINE W REFLEX MICROSCOPIC
Bilirubin Urine: NEGATIVE
Glucose, UA: NEGATIVE mg/dL
Ketones, ur: >80 mg/dL — AB
Leukocytes,Ua: NEGATIVE
Nitrite: NEGATIVE
Protein, ur: NEGATIVE mg/dL
Specific Gravity, Urine: 1.025 (ref 1.005–1.030)
pH: 8 (ref 5.0–8.0)

## 2019-04-14 LAB — URINALYSIS, MICROSCOPIC (REFLEX): WBC, UA: NONE SEEN WBC/hpf (ref 0–5)

## 2019-04-14 MED ORDER — ONDANSETRON HCL 4 MG/2ML IJ SOLN
4.0000 mg | Freq: Once | INTRAMUSCULAR | Status: AC
  Administered 2019-04-14: 19:00:00 4 mg via INTRAVENOUS
  Filled 2019-04-14: qty 2

## 2019-04-14 MED ORDER — HYDROCODONE-ACETAMINOPHEN 5-325 MG PO TABS
2.0000 | ORAL_TABLET | Freq: Once | ORAL | Status: AC
  Administered 2019-04-14: 2 via ORAL
  Filled 2019-04-14: qty 2

## 2019-04-14 MED ORDER — ONDANSETRON HCL 4 MG PO TABS: 4.0000 mg | ORAL_TABLET | Freq: Four times a day (QID) | ORAL | 0 refills | Status: AC

## 2019-04-14 MED ORDER — SODIUM CHLORIDE 0.9 % IV BOLUS (SEPSIS)
1000.0000 mL | Freq: Once | INTRAVENOUS | Status: AC
  Administered 2019-04-14: 1000 mL via INTRAVENOUS

## 2019-04-14 MED ORDER — TAMSULOSIN HCL 0.4 MG PO CAPS: 0.4000 mg | ORAL_CAPSULE | Freq: Every day | ORAL | 0 refills | Status: AC

## 2019-04-14 MED ORDER — ONDANSETRON 4 MG PO TBDP
4.0000 mg | ORAL_TABLET | Freq: Once | ORAL | Status: AC
  Administered 2019-04-14: 4 mg via ORAL
  Filled 2019-04-14: qty 1

## 2019-04-14 MED ORDER — KETOROLAC TROMETHAMINE 15 MG/ML IJ SOLN
15.0000 mg | Freq: Once | INTRAMUSCULAR | Status: AC
  Administered 2019-04-14: 15 mg via INTRAVENOUS
  Filled 2019-04-14: qty 1

## 2019-04-14 MED ORDER — HYDROCODONE-ACETAMINOPHEN 5-325 MG PO TABS: 2.0000 | ORAL_TABLET | ORAL | 0 refills | Status: AC | PRN

## 2019-04-14 NOTE — Discharge Instructions (Addendum)
You must follow up with urology ASAP.

## 2019-04-14 NOTE — ED Notes (Signed)
Pt states he "has been to a few hospitals and is not receiving good treatment and no one believes that he is in pain and needs pain medication"

## 2019-04-14 NOTE — ED Notes (Signed)
Patient transported to CT 

## 2019-04-14 NOTE — ED Notes (Signed)
Pt informed that we need urine specimen, and that unable to provide medication until urine sample collected. Pt given urinal

## 2019-04-14 NOTE — ED Triage Notes (Addendum)
Pt c/o right side abd pain x 3 days-states feels like kidney stone-pt with ED visit x 2 prior to arrival

## 2019-04-14 NOTE — ED Notes (Signed)
Went in room to give zofran  Pt standing by bed holding his IV tubing  Pt states he disconnected it to go to the restroom

## 2019-04-14 NOTE — ED Provider Notes (Signed)
[facility] EMERGENCY DEPARTMENT Provider Note   CSN: [id number] Arrival date & time: 04/14/19  1600     History   Chief Complaint Chief Complaint  Patient presents with  . Abdominal Pain    HPI Tom Munoz is a 28 y.o. male.     Patient is a 28 y/o male with PMH THC hyperemesis, chronic pain, gastroparesis presenting to the ER for complains of RUQ and flank pain for 1 week. Reports he was seen in another hospital last Wednesday and diagnosed with a kidney stone. He did not f/u with urology as instructed. Reports last night he woke up with worse pain in the RUQ and R flank. Reports n/v. Denies hematuria, dysuria, SOB, fever, chills.      Past Medical History:  Diagnosis Date  . Acid reflux   . Cannabinoid hyperemesis syndrome (HCC)   . Chronic pain syndrome   . Cyclic vomiting syndrome   . Drug-seeking behavior   . Gastroparesis   . GERD (gastroesophageal reflux disease)     Patient Active Problem List   Diagnosis Date Noted  . PUD (peptic ulcer disease) 05/08/2018  . RUQ pain   . Abdominal pain 05/06/2018  . Intractable nausea and vomiting 05/06/2018  . Leukocytosis 05/06/2018  . Diarrhea 05/06/2018  . Hypokalemia 05/06/2018  . Transient elevated blood pressure 05/06/2018    Past Surgical History:  Procedure Laterality Date  . APPENDECTOMY    . APPENDECTOMY  2012        Home Medications    Prior to Admission medications   Medication Sig Start Date End Date Taking? Authorizing Provider  lidocaine (XYLOCAINE) 2 % solution Use as directed 15 mLs in the mouth or throat every 4 (four) hours as needed for mouth pain. 05/09/18   Rhetta MuraSamtani, Jai-Gurmukh, MD  omeprazole (PRILOSEC) 40 MG capsule Take 1 capsule (40 mg total) by mouth daily. 05/09/18   Rhetta MuraSamtani, Jai-Gurmukh, MD  potassium chloride SA (K-DUR,KLOR-CON) 20 MEQ tablet Take 2 tablets (40 mEq total) by mouth daily. 05/10/18   Rhetta MuraSamtani, Jai-Gurmukh, MD  promethazine (PHENERGAN) 25  MG tablet Take 1 tablet (25 mg total) by mouth every 6 (six) hours as needed for nausea or vomiting. 08/04/18   Elpidio AnisUpstill, Shari, PA-C  sucralfate (CARAFATE) 1 GM/10ML suspension Take 1 g by mouth 4 (four) times daily -  with meals and at bedtime.    [provider]  sucralfate (CARAFATE) 1 GM/10ML suspension Take 10 mLs (1 g total) by mouth 4 (four) times daily -  with meals and at bedtime. 06/19/18   Elvina SidleLauenstein, Kurt, MD  sucralfate (CARAFATE) 1 GM/10ML suspension Take 10 mLs (1 g total) by mouth 4 (four) times daily -  with meals and at bedtime for 7 days. 05/09/18 05/16/18  Rhetta MuraSamtani, Jai-Gurmukh, MD  traMADol (ULTRAM) 50 MG tablet Take 1 tablet (50 mg total) by mouth every 6 (six) hours as needed. 08/04/18   Elpidio AnisUpstill, Shari, PA-C    Family History Family History  Problem Relation Age of Onset  . Gallbladder disease Mother     Social History Social History   Tobacco Use  . Smoking status: Never Smoker  . Smokeless tobacco: Never Used  Substance Use Topics  . Alcohol use: Never    Frequency: Never  . Drug use: Not Currently    Types: Marijuana     Allergies   Bentyl [dicyclomine] and Reglan [metoclopramide]   Review of Systems Review of Systems  Constitutional: Negative for appetite change, chills, fatigue  and fever.  Respiratory: Negative for cough and shortness of breath.   Cardiovascular: Negative for chest pain.  Gastrointestinal: Positive for abdominal pain, nausea and vomiting. Negative for abdominal distention, blood in stool, constipation and diarrhea.  Genitourinary: Positive for flank pain. Negative for decreased urine volume, discharge, dysuria, hematuria, penile pain, penile swelling, testicular pain and urgency.  Musculoskeletal: Positive for back pain.  Skin: Negative for wound.  Allergic/Immunologic: Negative for immunocompromised state.  Neurological: Negative for dizziness, light-headedness and headaches.  Hematological: Does not bruise/bleed easily.      Physical Exam Updated Vital Signs BP (!) 143/96 (BP Location: Right Arm)   Pulse 72   Temp 98.5 F (36.9 C) (Oral)   Resp 20   Ht 5\' 6"  (1.676 m)   Wt 72.6 kg   SpO2 100%   BMI 25.82 kg/m   Physical Exam Vitals signs and nursing note reviewed.  Constitutional:      Appearance: He is well-developed.  HENT:     Head: Normocephalic and atraumatic.     Mouth/Throat:     Mouth: Mucous membranes are moist.  Eyes:     Conjunctiva/sclera: Conjunctivae normal.  Neck:     Musculoskeletal: Neck supple.  Cardiovascular:     Rate and Rhythm: Normal rate and regular rhythm.     Heart sounds: No murmur.  Pulmonary:     Effort: Pulmonary effort is normal. No respiratory distress.     Breath sounds: Normal breath sounds.  Abdominal:     General: Abdomen is flat.     Palpations: Abdomen is soft.     Tenderness: There is abdominal tenderness in the right upper quadrant. There is right CVA tenderness.  Skin:    General: Skin is warm and dry.  Neurological:     Mental Status: He is alert.  Psychiatric:        Mood and Affect: Mood normal.      ED Treatments / Results  Labs (all labs ordered are listed, but only abnormal results are displayed) Labs Reviewed  URINALYSIS, ROUTINE W REFLEX MICROSCOPIC - Abnormal; Notable for the following components:      Result Value   APPearance CLOUDY (*)    Hgb urine dipstick LARGE (*)    Ketones, ur >80 (*)    All other components within normal limits  URINALYSIS, MICROSCOPIC (REFLEX) - Abnormal; Notable for the following components:   Bacteria, UA RARE (*)    All other components within normal limits  CBC WITH DIFFERENTIAL/PLATELET  COMPREHENSIVE METABOLIC PANEL  LIPASE, BLOOD    EKG None  Radiology No results found.  Procedures Procedures (including critical care time)  Medications Ordered in ED Medications  ketorolac (TORADOL) 15 MG/ML injection 15 mg (has no administration in time range)  sodium chloride 0.9 % bolus 1,000  mL (1,000 mLs Intravenous New Bag/Given 04/14/19 1923)  ondansetron (ZOFRAN) injection 4 mg (has no administration in time range)     Initial Impression / Assessment and Plan / ED Course  I have reviewed the triage vital signs and the nursing notes.  Pertinent labs & imaging results that were available during my care of the patient were reviewed by me and considered in my medical decision making (see chart for details).  Clinical Course as of Apr 13 2112  Tue Apr 14, 2019  1922 Patient here for flank pain, diagnosed with kidney stone 1 week ago but did not follow up with urologist. Reports worse pain today. Seen at 2 ERs today, prior to  coming here but pulled out IV and left AMA and demanded pain medications. I reviewed labs from earlier today. Creatinin 1.2, white count 14 but otherwise unremarkable. Will repeat CT renal study today. Offered toradol, zofran. UA with hemoglobin but does not appear infected.    [KM]  2111 Ct scan shows 52mm stone, mild hydronephrosis. Patient improved with toradol, hydrocodone, zofran. Discussed with Dr. Clarene Duke will d/c and patient was again advised to f/u with urology ASAP.    [KM]    Clinical Course User Index [KM] Arlyn Dunning, PA-C       Based on review of vitals, medical screening exam, lab work and/or imaging, there does not appear to be an acute, emergent etiology for the patient's symptoms. Counseled pt on good return precautions and encouraged both PCP and ED follow-up as needed.  Prior to discharge, I also discussed incidental imaging findings with patient in detail and advised appropriate, recommended follow-up in detail.  Clinical Impression: 1. Kidney stone     Disposition: Discharge  Prior to providing a prescription for a controlled substance, I independently reviewed the patient's recent prescription history on the [place] Controlled Substance Reporting System. The patient had no recent or regular prescriptions and was deemed  appropriate for a brief, less than 3 day prescription of narcotic for acute analgesia.  This note was prepared with assistance of Conservation officer, historic buildings. Occasional wrong-word or sound-a-like substitutions may have occurred due to the inherent limitations of voice recognition software.   Final Clinical Impressions(s) / ED Diagnoses   Final diagnoses:  None    ED Discharge Orders    None       Tom Munoz 04/14/19 2114    Little, Ambrose Finland, MD 04/15/19 1413

## 2019-10-30 ENCOUNTER — Encounter (HOSPITAL_BASED_OUTPATIENT_CLINIC_OR_DEPARTMENT_OTHER): Payer: Self-pay | Admitting: *Deleted

## 2019-10-30 ENCOUNTER — Emergency Department (HOSPITAL_BASED_OUTPATIENT_CLINIC_OR_DEPARTMENT_OTHER)
Admission: EM | Admit: 2019-10-30 | Discharge: 2019-10-30 | Disposition: A | Discharge status: Home / Self Care | Payer: Self-pay | Attending: Emergency Medicine | Admitting: Emergency Medicine

## 2019-10-30 ENCOUNTER — Other Ambulatory Visit: Payer: Self-pay

## 2019-10-30 DIAGNOSIS — M545 Low back pain, unspecified: Secondary | ICD-10-CM

## 2019-10-30 DIAGNOSIS — Z79899 Other long term (current) drug therapy: Secondary | ICD-10-CM | POA: Insufficient documentation

## 2019-10-30 HISTORY — DX: Patient's noncompliance with dietary regimen: Z91.11

## 2019-10-30 HISTORY — DX: Patient's noncompliance with other medical treatment and regimen due to unspecified reason: Z91.199

## 2019-10-30 LAB — URINALYSIS, ROUTINE W REFLEX MICROSCOPIC
Bilirubin Urine: NEGATIVE
Glucose, UA: NEGATIVE mg/dL
Ketones, ur: 15 mg/dL — AB
Leukocytes,Ua: NEGATIVE
Nitrite: NEGATIVE
Protein, ur: 30 mg/dL — AB
Specific Gravity, Urine: 1.02 (ref 1.005–1.030)
pH: 5.5 (ref 5.0–8.0)

## 2019-10-30 LAB — URINALYSIS, MICROSCOPIC (REFLEX)

## 2019-10-30 MED ORDER — KETOROLAC TROMETHAMINE 60 MG/2ML IM SOLN
60.0000 mg | Freq: Once | INTRAMUSCULAR | Status: AC
  Administered 2019-10-30: 60 mg via INTRAMUSCULAR
  Filled 2019-10-30: qty 2

## 2019-10-30 MED ORDER — IBUPROFEN 400 MG PO TABS
400.0000 mg | ORAL_TABLET | Freq: Once | ORAL | Status: AC
  Administered 2019-10-30: 19:00:00 400 mg via ORAL
  Filled 2019-10-30: qty 1

## 2019-10-30 MED ORDER — CAPSAICIN 0.075 % EX CREA
1.00 | TOPICAL_CREAM | CUTANEOUS | Status: DC
Start: ? — End: 2019-10-30

## 2019-10-30 MED ORDER — DIPHENHYDRAMINE HCL 50 MG/ML IJ SOLN
25.0000 mg | Freq: Once | INTRAMUSCULAR | Status: AC
  Administered 2019-10-30: 25 mg via INTRAMUSCULAR
  Filled 2019-10-30: qty 1

## 2019-10-30 NOTE — Discharge Instructions (Signed)
Schedule ongoing care with a primary care provider

## 2019-10-30 NOTE — ED Notes (Signed)
Pt just left [facility] and had lab work done there. See Care Everywhere.

## 2019-10-30 NOTE — ED Provider Notes (Signed)
[facility] EMERGENCY DEPARTMENT Provider Note   CSN: [id number] Arrival date & time: 10/30/19  1914     History Chief Complaint  Patient presents with  . Back Pain    Tom Munoz is a 29 y.o. male.  The history is provided by the patient. No language interpreter was used.  Back Pain Location:  Lumbar spine Quality:  Aching Radiates to:  Does not radiate Pain severity:  Moderate Pain is:  Same all the time Onset quality:  Sudden Timing:  Constant Progression:  Worsening Chronicity:  New Relieved by:  Nothing Ineffective treatments:  None tried Associated symptoms: no abdominal pain    Pt thinks he has a kidney stone.  Pt reports he has had stones before that were missed.  Pt was at [facility] before coming here.      Past Medical History:  Diagnosis Date  . Acid reflux   . Cannabinoid hyperemesis syndrome   . Chronic pain syndrome   . Cyclic vomiting syndrome   . Drug-seeking behavior   . Gastroparesis   . GERD (gastroesophageal reflux disease)   . Noncompliance with treatment plan     Patient Active Problem List   Diagnosis Date Noted  . PUD (peptic ulcer disease) 05/08/2018  . RUQ pain   . Abdominal pain 05/06/2018  . Intractable nausea and vomiting 05/06/2018  . Leukocytosis 05/06/2018  . Diarrhea 05/06/2018  . Hypokalemia 05/06/2018  . Transient elevated blood pressure 05/06/2018    Past Surgical History:  Procedure Laterality Date  . APPENDECTOMY    . APPENDECTOMY  2012       Family History  Problem Relation Age of Onset  . Gallbladder disease Mother     Social History   Tobacco Use  . Smoking status: Never Smoker  . Smokeless tobacco: Never Used  Substance Use Topics  . Alcohol use: Never  . Drug use: Not Currently    Types: Marijuana    Home Medications Prior to Admission medications   Medication Sig Start Date End Date Taking? Authorizing Provider  HYDROcodone-acetaminophen (NORCO/VICODIN)  5-325 MG tablet Take 2 tablets by mouth every 4 (four) hours as needed. 04/14/19   Madilyn Hook A, PA-C  lidocaine (XYLOCAINE) 2 % solution Use as directed 15 mLs in the mouth or throat every 4 (four) hours as needed for mouth pain. 05/09/18   Nita Sells, MD  omeprazole (PRILOSEC) 40 MG capsule Take 1 capsule (40 mg total) by mouth daily. 05/09/18   Nita Sells, MD  ondansetron (ZOFRAN) 4 MG tablet Take 1 tablet (4 mg total) by mouth every 6 (six) hours. 04/14/19   Madilyn Hook A, PA-C  potassium chloride SA (K-DUR,KLOR-CON) 20 MEQ tablet Take 2 tablets (40 mEq total) by mouth daily. 05/10/18   Nita Sells, MD  promethazine (PHENERGAN) 25 MG tablet Take 1 tablet (25 mg total) by mouth every 6 (six) hours as needed for nausea or vomiting. 08/04/18   Charlann Lange, PA-C  sucralfate (CARAFATE) 1 GM/10ML suspension Take 1 g by mouth 4 (four) times daily -  with meals and at bedtime.    [provider]  sucralfate (CARAFATE) 1 GM/10ML suspension Take 10 mLs (1 g total) by mouth 4 (four) times daily -  with meals and at bedtime. 06/19/18   Robyn Haber, MD  sucralfate (CARAFATE) 1 GM/10ML suspension Take 10 mLs (1 g total) by mouth 4 (four) times daily -  with meals and at bedtime for 7 days. 05/09/18 05/16/18  Rhetta Mura, MD  traMADol (ULTRAM) 50 MG tablet Take 1 tablet (50 mg total) by mouth every 6 (six) hours as needed. 08/04/18   Elpidio Anis, PA-C    Allergies    Bentyl [dicyclomine] and Reglan [metoclopramide]  Review of Systems   Review of Systems  Gastrointestinal: Negative for abdominal pain.  Musculoskeletal: Positive for back pain.  All other systems reviewed and are negative.   Physical Exam Updated Vital Signs BP (!) 146/90 (BP Location: Right Arm)   Pulse 87   Resp 18   Ht 5\' 6"  (1.676 m)   Wt 65.8 kg   SpO2 98%   BMI 23.40 kg/m   Physical Exam Vitals and nursing note reviewed.  Constitutional:      Appearance: He is  well-developed.  HENT:     Head: Normocephalic and atraumatic.  Eyes:     Conjunctiva/sclera: Conjunctivae normal.  Cardiovascular:     Rate and Rhythm: Normal rate and regular rhythm.     Heart sounds: No murmur.  Pulmonary:     Effort: Pulmonary effort is normal. No respiratory distress.     Breath sounds: Normal breath sounds.  Abdominal:     Palpations: Abdomen is soft.     Tenderness: There is no abdominal tenderness.  Musculoskeletal:     Cervical back: Neck supple.  Skin:    General: Skin is warm and dry.  Neurological:     General: No focal deficit present.     Mental Status: He is alert.  Psychiatric:        Mood and Affect: Mood normal.     ED Results / Procedures / Treatments   Labs (all labs ordered are listed, but only abnormal results are displayed) Labs Reviewed  URINALYSIS, ROUTINE W REFLEX MICROSCOPIC - Abnormal; Notable for the following components:      Result Value   Hgb urine dipstick MODERATE (*)    Ketones, ur 15 (*)    Protein, ur 30 (*)    All other components within normal limits  URINALYSIS, MICROSCOPIC (REFLEX) - Abnormal; Notable for the following components:   Bacteria, UA RARE (*)    All other components within normal limits    EKG None  Radiology No results found.  Procedures Procedures (including critical care time)  Medications Ordered in ED Medications  ibuprofen (ADVIL) tablet 400 mg (400 mg Oral Given 10/30/19 1925)  ketorolac (TORADOL) injection 60 mg (60 mg Intramuscular Given 10/30/19 2203)  diphenhydrAMINE (BENADRYL) injection 25 mg (25 mg Intramuscular Given 10/30/19 2203)    ED Course  I have reviewed the triage vital signs and the nursing notes.  Pertinent labs & imaging results that were available during my care of the patient were reviewed by me and considered in my medical decision making (see chart for details).    MDM Rules/Calculators/A&P                      MDM:Pt has frequent ED visits.  Care  everywhere reviewed  Pt given toradol and benadryl.  Pt drinking water in room.  Pt advised to continue oral fluids.  Pt had ct scan in march 2021 showing no stones.  I doubt kidney stone.  Pt advised to establish primary care  Final Clinical Impression(s) / ED Diagnoses Final diagnoses:  Low back pain without sciatica, unspecified back pain laterality, unspecified chronicity    Rx / DC Orders ED Discharge Orders    None    An After Visit  Summary was printed and given to the patient.    Osie Cheeks 10/30/19 2211    Pollyann Savoy, MD 10/30/19 2256

## 2019-10-30 NOTE — ED Notes (Signed)
Pt states he is unable to urinate.

## 2019-10-30 NOTE — ED Notes (Signed)
ED Provider at bedside. 

## 2019-10-30 NOTE — ED Triage Notes (Addendum)
Right flank pain x 3 days. He is moaning at triage. States he has not taken any pain medication for the pain. He is ambulatory. States he just moved here from [place] and normally goes to [employer].

## 2020-07-25 IMAGING — CT CT RENAL STONE PROTOCOL
2 of 4 series · 17 of 46 positions shown, 19 images · non-contrast
Comparison: None.

CLINICAL DATA: Acute right flank pain.

EXAM:
CT ABDOMEN AND PELVIS WITHOUT CONTRAST
TECHNIQUE: Multidetector CT imaging of the abdomen and pelvis was performed
following the standard protocol without IV contrast.

[Series 2: axial st · axial · 0.69mm/px · z∈[-447,-47]mm · 14 of 88 slices shown, 16 images]
[im 4/88  soft-tissue]
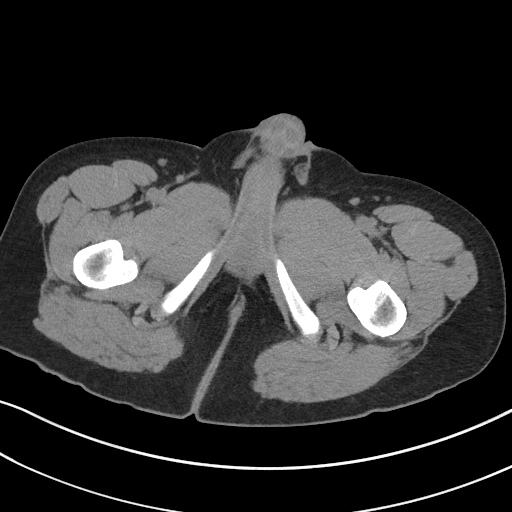
[im 4/88  bone]
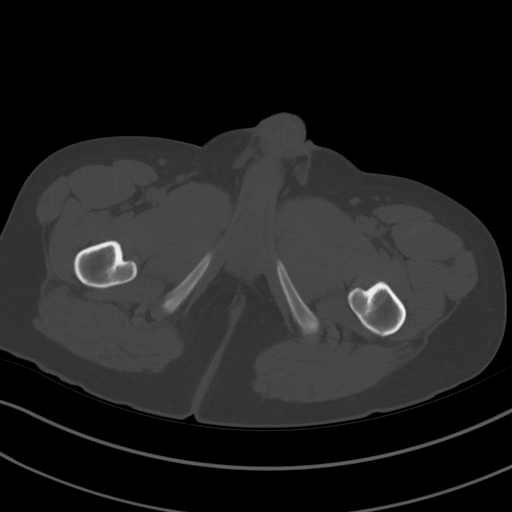
[im 12/88  soft-tissue]
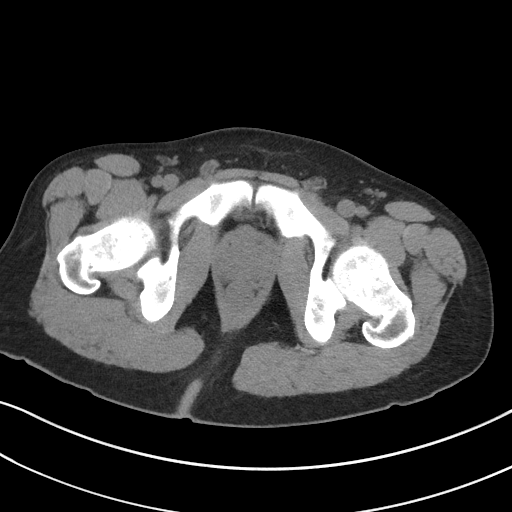
[im 16/88  soft-tissue]
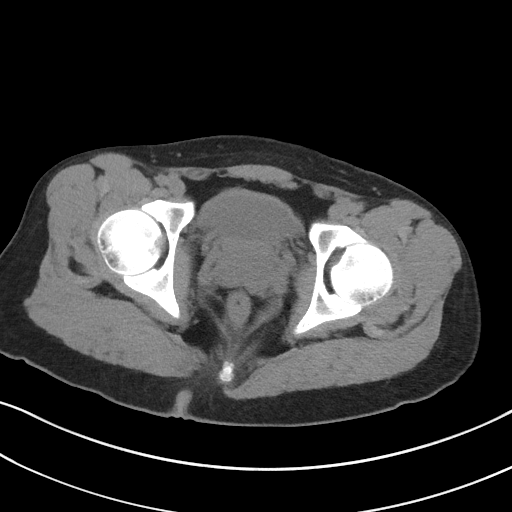
[im 23/88  soft-tissue]
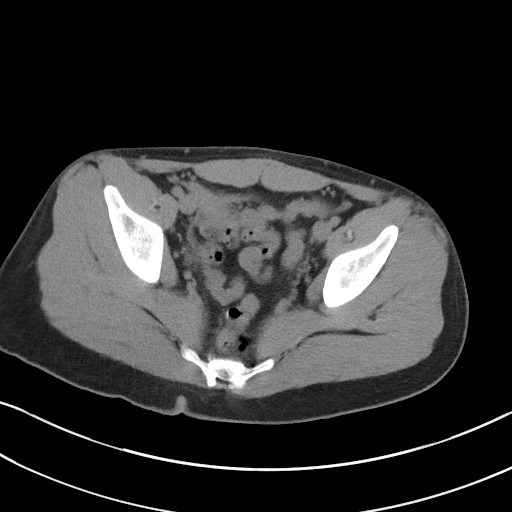
[im 31/88  soft-tissue]
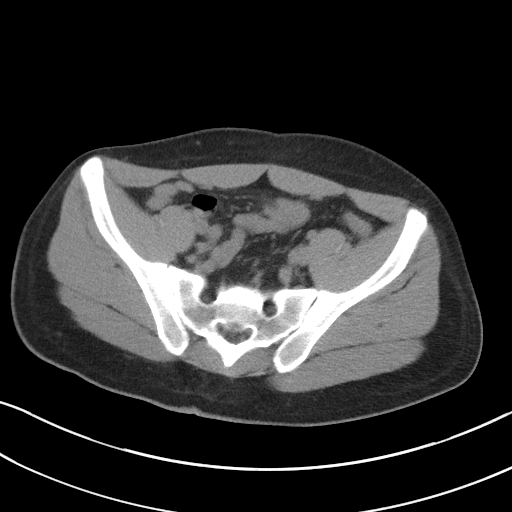
[im 35/88  soft-tissue]
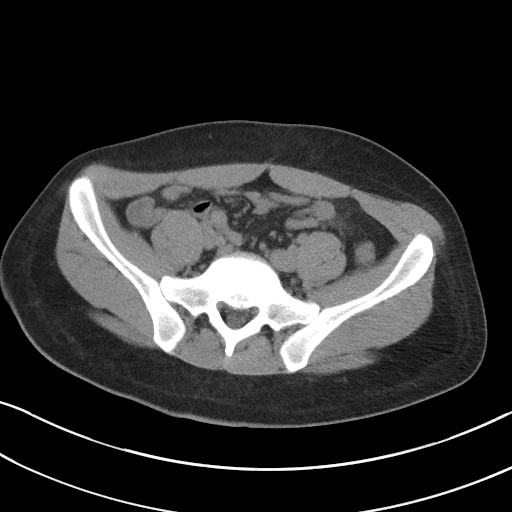
[im 42/88  soft-tissue]
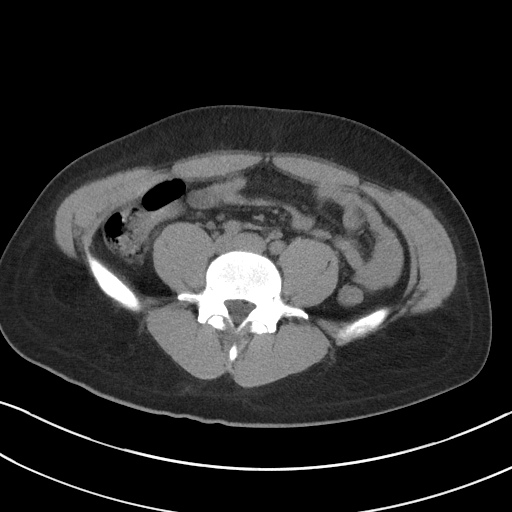
[im 46/88  soft-tissue]
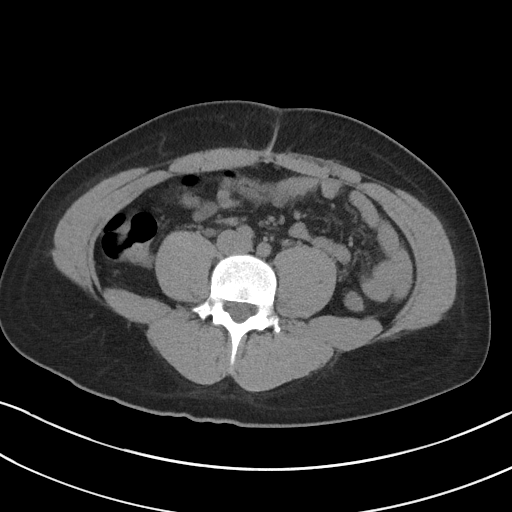
[im 53/88  soft-tissue]
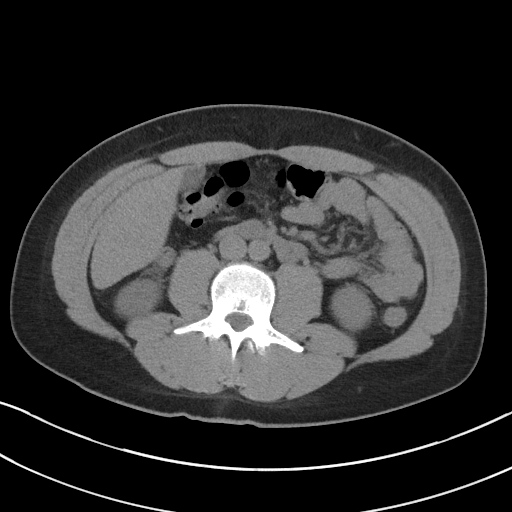
[im 53/88  bone]
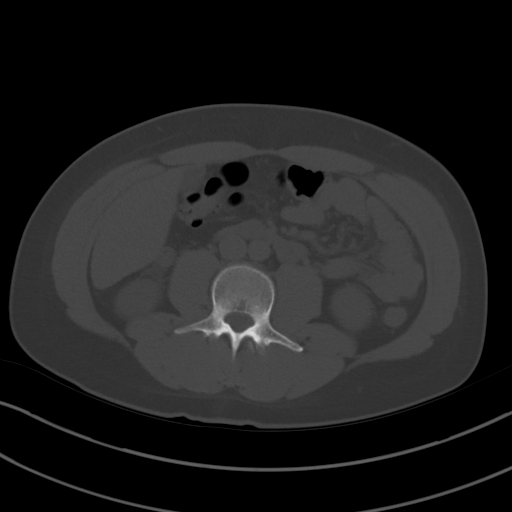
[im 57/88  soft-tissue]
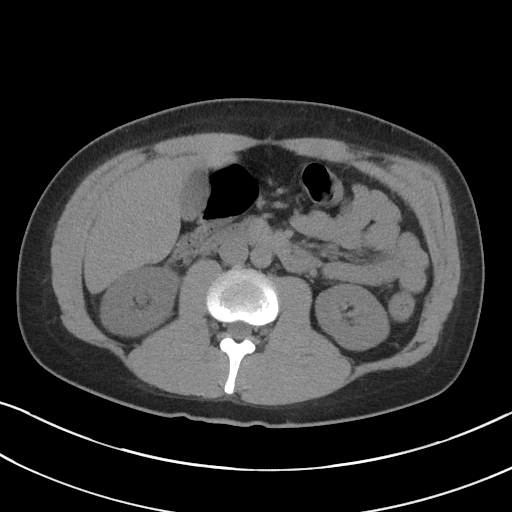
[im 65/88  soft-tissue]
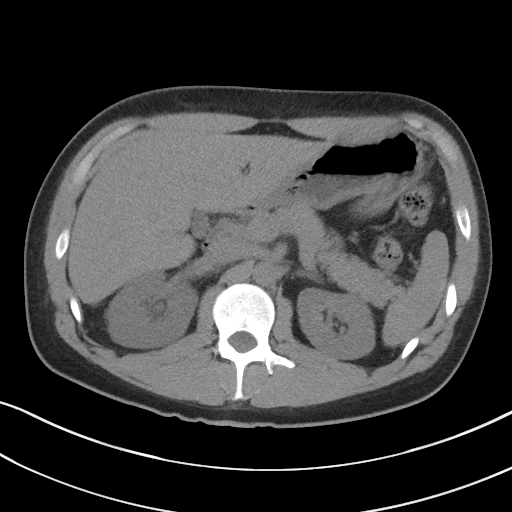
[im 72/88  soft-tissue]
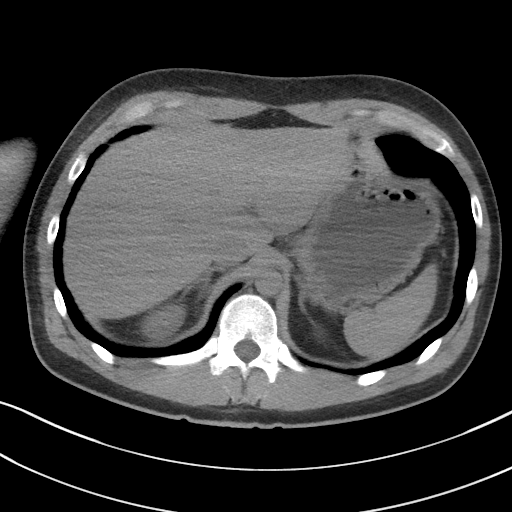
[im 76/88  soft-tissue]
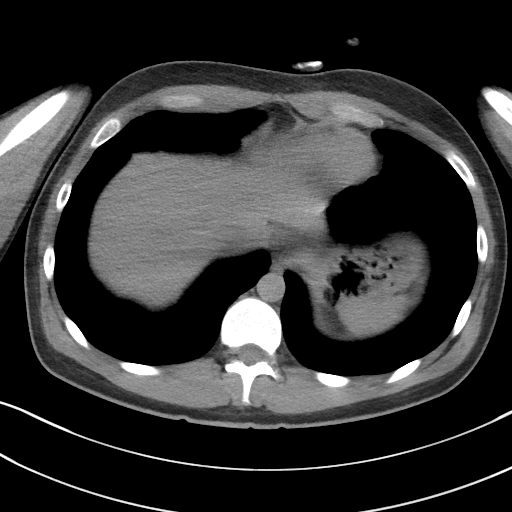
[im 84/88  soft-tissue]
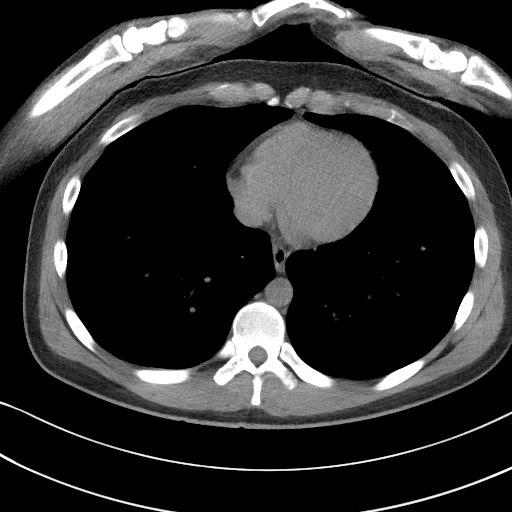

[Series 4: coronal st · coronal · 0.70mm/px · 3 of 94 slices shown]
[im 32/94  soft-tissue]
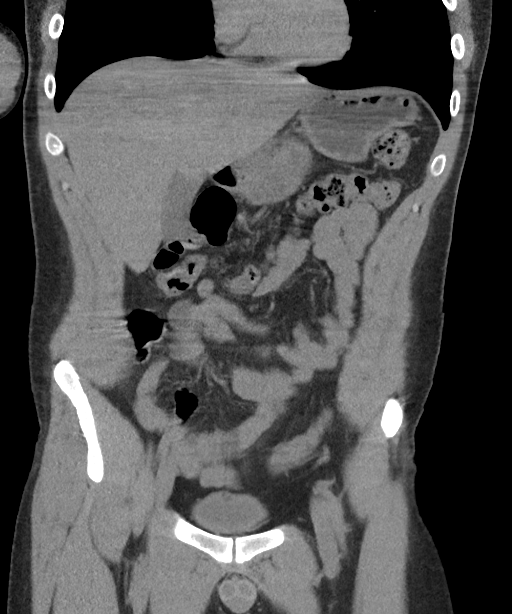
[im 42/94  soft-tissue]
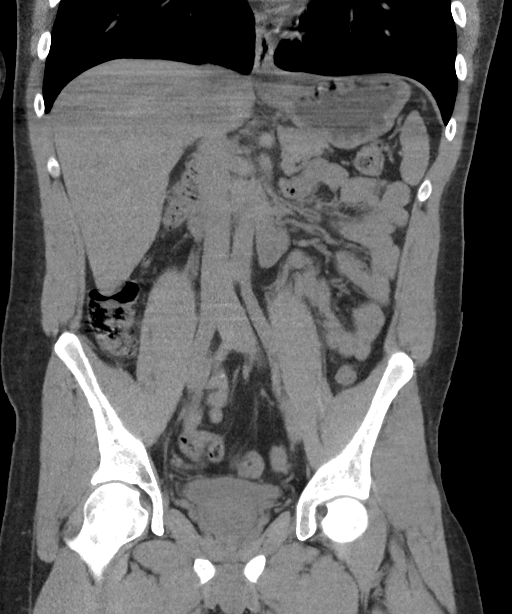
[im 52/94  soft-tissue]
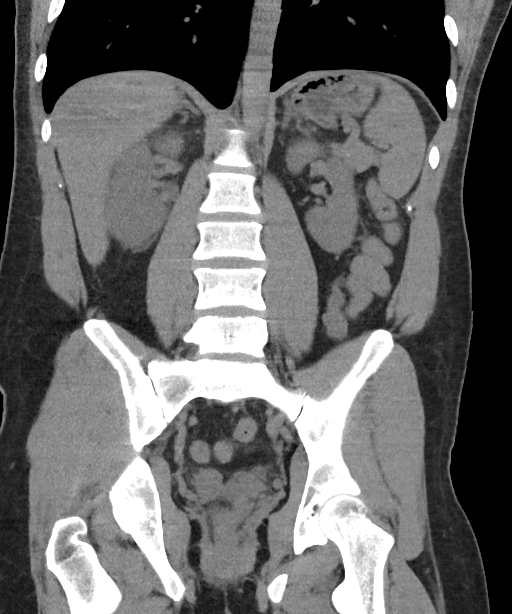

[17 of 46 positions shown; findings below may reference images not displayed]

FINDINGS: Lower chest: No acute abnormality.

Hepatobiliary: No focal liver abnormality is seen. No gallstones,
gallbladder wall thickening, or biliary dilatation.

Pancreas: Unremarkable. No pancreatic ductal dilatation or
surrounding inflammatory changes.

Spleen: Normal in size without focal abnormality.

Adrenals/Urinary Tract: Adrenal glands appear normal. Nonobstructive
left renal calculus is noted. Mild right hydroureteronephrosis is
noted secondary to 3 mm calculus in distal right ureter. Urinary
bladder is decompressed.

Stomach/Bowel: The stomach appears normal. Status post appendectomy.
There is no evidence of bowel obstruction or inflammation.

Vascular/Lymphatic: No significant vascular findings are present. No
enlarged abdominal or pelvic lymph nodes.

Reproductive: Prostate is unremarkable.

Other: No abdominal wall hernia or abnormality. No abdominopelvic
ascites.

Musculoskeletal: No acute or significant osseous findings.
IMPRESSION: Mild right hydroureteronephrosis is noted secondary to 3 mm calculus
in distal right ureter. Nonobstructive left renal calculus.

## 2022-11-14 ENCOUNTER — Emergency Department (HOSPITAL_COMMUNITY)
Admission: EM | Admit: 2022-11-14 | Discharge: 2022-11-14 | Discharge status: Against Medical Advice | Payer: BLUE CROSS/BLUE SHIELD | Attending: Emergency Medicine | Admitting: Emergency Medicine

## 2022-11-14 ENCOUNTER — Emergency Department (HOSPITAL_COMMUNITY): Payer: BLUE CROSS/BLUE SHIELD

## 2022-11-14 ENCOUNTER — Encounter (HOSPITAL_COMMUNITY): Payer: Self-pay

## 2022-11-14 DIAGNOSIS — R000 Tachycardia, unspecified: Secondary | ICD-10-CM | POA: Insufficient documentation

## 2022-11-14 DIAGNOSIS — R1084 Generalized abdominal pain: Secondary | ICD-10-CM | POA: Diagnosis not present

## 2022-11-14 DIAGNOSIS — R1110 Vomiting, unspecified: Secondary | ICD-10-CM | POA: Insufficient documentation

## 2022-11-14 DIAGNOSIS — R109 Unspecified abdominal pain: Secondary | ICD-10-CM | POA: Diagnosis present

## 2022-11-14 DIAGNOSIS — Z5329 Procedure and treatment not carried out because of patient's decision for other reasons: Secondary | ICD-10-CM | POA: Diagnosis not present

## 2022-11-14 HISTORY — DX: Diaphragmatic hernia without obstruction or gangrene: K44.9

## 2022-11-14 HISTORY — DX: Peptic ulcer, site unspecified, unspecified as acute or chronic, without hemorrhage or perforation: K27.9

## 2022-11-14 HISTORY — DX: Anxiety disorder, unspecified: F41.9

## 2022-11-14 LAB — COMPREHENSIVE METABOLIC PANEL
ALT: 26 U/L (ref 0–44)
AST: 25 U/L (ref 15–41)
Albumin: 4.8 g/dL (ref 3.5–5.0)
Alkaline Phosphatase: 103 U/L (ref 38–126)
Anion gap: 11 (ref 5–15)
BUN: 10 mg/dL (ref 6–20)
CO2: 26 mmol/L (ref 22–32)
Calcium: 9.2 mg/dL (ref 8.9–10.3)
Chloride: 100 mmol/L (ref 98–111)
Creatinine, Ser: 0.95 mg/dL (ref 0.61–1.24)
GFR, Estimated: >60 mL/min (ref >60)
Glucose, Bld: 96 mg/dL (ref 70–99)
Potassium: 3.6 mmol/L (ref 3.5–5.1)
Sodium: 137 mmol/L (ref 135–145)
Total Bilirubin: 0.6 mg/dL (ref 0.3–1.2)
Total Protein: 8.1 g/dL (ref 6.5–8.1)

## 2022-11-14 LAB — CBC WITH DIFFERENTIAL/PLATELET
Abs Immature Granulocytes: 0.02 10*3/uL (ref 0.00–0.07)
Basophils Absolute: 0 10*3/uL (ref 0.0–0.1)
Basophils Relative: 0 %
Eosinophils Absolute: 0 10*3/uL (ref 0.0–0.5)
Eosinophils Relative: 0 %
HCT: 47.5 % (ref 39.0–52.0)
Hemoglobin: 15.8 g/dL (ref 13.0–17.0)
Immature Granulocytes: 0 %
Lymphocytes Relative: 13 %
Lymphs Abs: 1 10*3/uL (ref 0.7–4.0)
MCH: 29.9 pg (ref 26.0–34.0)
MCHC: 33.3 g/dL (ref 30.0–36.0)
MCV: 89.8 fL (ref 80.0–100.0)
Monocytes Absolute: 0.2 10*3/uL (ref 0.1–1.0)
Monocytes Relative: 2 %
Neutro Abs: 6.5 10*3/uL (ref 1.7–7.7)
Neutrophils Relative %: 85 %
Platelets: 184 10*3/uL (ref 150–400)
RBC: 5.29 MIL/uL (ref 4.22–5.81)
RDW: 13.1 % (ref 11.5–15.5)
WBC: 7.7 10*3/uL (ref 4.0–10.5)
nRBC: 0 % (ref 0.0–0.2)

## 2022-11-14 LAB — LIPASE, BLOOD: Lipase: 25 U/L (ref 11–51)

## 2022-11-14 MED ORDER — ONDANSETRON HCL 4 MG/2ML IJ SOLN
4.0000 mg | Freq: Once | INTRAMUSCULAR | Status: AC
  Administered 2022-11-14: 4 mg via INTRAVENOUS
  Filled 2022-11-14: qty 2

## 2022-11-14 MED ORDER — KETOROLAC TROMETHAMINE 15 MG/ML IJ SOLN
15.0000 mg | Freq: Once | INTRAMUSCULAR | Status: AC
  Administered 2022-11-14: 15 mg via INTRAVENOUS
  Filled 2022-11-14: qty 1

## 2022-11-14 MED ORDER — SODIUM CHLORIDE 0.9 % IV SOLN
12.5000 mg | Freq: Four times a day (QID) | INTRAVENOUS | Status: DC | PRN
  Filled 2022-11-14: qty 0.5

## 2022-11-14 MED ORDER — CAPSAICIN 0.025 % EX CREA
TOPICAL_CREAM | Freq: Once | CUTANEOUS | Status: AC
  Filled 2022-11-14: qty 60

## 2022-11-14 NOTE — ED Notes (Signed)
RN tried to give pt more nausea meds. Pt stated he wants pain meds. Pt stated he was leaving and continues to walk out of room. RN stated toradol was given. Pt stated "I'm allergic to toradol". RN asked "why did you let me give you pain medication that you are allergic too". Pt stated "I'm getting whatever pain medication I can get". PA notified of pt trying to leave. PA offered Ativan for relaxation for the CT and pt stated "I'm allergic to that too". Pt still yelling at bedside with visitor (father).

## 2022-11-14 NOTE — ED Triage Notes (Addendum)
Pt arrived POV for continued abd pain that has been intermittent for past 15+ years, hx of GERD, gastroparesis, and hernia pain. Pt reports dx with a "hernia" on Friday by PCP. Pt was seen at [facility] yesterday, then recently d/c from [facility]) tonight for same. Pt reports left d/t "law suit". VSS, NAD noted at current. Recent labs in care everywhere from 4/28 at [facility].  Note in chart states "Frequent ED visits, refusing labs, anti-emetics and fluids unless Dilaudid is order"

## 2022-11-14 NOTE — ED Provider Notes (Signed)
[employer] EMERGENCY DEPARTMENT AT [facility] Provider Note   CSN: [id number] Arrival date & time: 11/14/22  4098     History  Chief Complaint  Patient presents with   Abdominal Pain    Tom Munoz is a 32 y.o. male who presents to ED complaining of chronic abdominal pain. Patient diagnosed with hernia on CT scan around 64mo ago. Pain increased in severity 4 days ago. Patient also endorses vomiting x4 days and fever/chills. Last BM today. Denies chest pain, dyspnea.   Abdominal Pain      Home Medications Prior to Admission medications   Medication Sig Start Date End Date Taking? Authorizing Provider  HYDROcodone-acetaminophen (NORCO/VICODIN) 5-325 MG tablet Take 2 tablets by mouth every 4 (four) hours as needed. 04/14/19   Ronnie Doss A, PA-C  lidocaine (XYLOCAINE) 2 % solution Use as directed 15 mLs in the mouth or throat every 4 (four) hours as needed for mouth pain. 05/09/18   Rhetta Mura, MD  omeprazole (PRILOSEC) 40 MG capsule Take 1 capsule (40 mg total) by mouth daily. 05/09/18   Rhetta Mura, MD  ondansetron (ZOFRAN) 4 MG tablet Take 1 tablet (4 mg total) by mouth every 6 (six) hours. 04/14/19   Ronnie Doss A, PA-C  potassium chloride SA (K-DUR,KLOR-CON) 20 MEQ tablet Take 2 tablets (40 mEq total) by mouth daily. 05/10/18   Rhetta Mura, MD  promethazine (PHENERGAN) 25 MG tablet Take 1 tablet (25 mg total) by mouth every 6 (six) hours as needed for nausea or vomiting. 08/04/18   Elpidio Anis, PA-C  sucralfate (CARAFATE) 1 GM/10ML suspension Take 1 g by mouth 4 (four) times daily -  with meals and at bedtime.    [provider]  sucralfate (CARAFATE) 1 GM/10ML suspension Take 10 mLs (1 g total) by mouth 4 (four) times daily -  with meals and at bedtime. 06/19/18   Elvina Sidle, MD  sucralfate (CARAFATE) 1 GM/10ML suspension Take 10 mLs (1 g total) by mouth 4 (four) times daily -  with meals and at bedtime  for 7 days. 05/09/18 05/16/18  Rhetta Mura, MD  traMADol (ULTRAM) 50 MG tablet Take 1 tablet (50 mg total) by mouth every 6 (six) hours as needed. 08/04/18   Elpidio Anis, PA-C      Allergies    Bentyl [dicyclomine] and Reglan [metoclopramide]    Review of Systems   Review of Systems  Gastrointestinal:  Positive for abdominal pain.    Physical Exam Updated Vital Signs BP (!) 140/84   Pulse (!) 109   Temp 99 F (37.2 C) (Oral)   Resp 17   Ht 5\' 6"  (1.676 m)   Wt 57.6 kg   SpO2 98%   BMI 20.50 kg/m  Physical Exam Vitals and nursing note reviewed.  Constitutional:      General: He is not in acute distress.    Appearance: He is not toxic-appearing.  HENT:     Head: Normocephalic and atraumatic.     Mouth/Throat:     Mouth: Mucous membranes are moist.     Pharynx: No oropharyngeal exudate or posterior oropharyngeal erythema.  Eyes:     General: No scleral icterus.       Right eye: No discharge.        Left eye: No discharge.     Conjunctiva/sclera: Conjunctivae normal.  Cardiovascular:     Rate and Rhythm: Normal rate.     Pulses: Normal pulses.     Heart sounds: No  murmur heard. Pulmonary:     Effort: Pulmonary effort is normal. No respiratory distress.     Breath sounds: Normal breath sounds. No wheezing, rhonchi or rales.  Abdominal:     General: Abdomen is flat. Bowel sounds are normal.     Palpations: Abdomen is soft. There is no hepatomegaly or mass.     Tenderness: There is generalized abdominal tenderness. There is no right CVA tenderness or left CVA tenderness.     Hernia: No hernia is present.  Musculoskeletal:     Right lower leg: No edema.     Left lower leg: No edema.  Skin:    General: Skin is warm and dry.     Findings: No rash.  Neurological:     General: No focal deficit present.     Mental Status: He is alert. Mental status is at baseline.  Psychiatric:        Mood and Affect: Mood normal.     ED Results / Procedures / Treatments    Labs (all labs ordered are listed, but only abnormal results are displayed) Labs Reviewed  COMPREHENSIVE METABOLIC PANEL  CBC WITH DIFFERENTIAL/PLATELET  LIPASE, BLOOD    EKG EKG Interpretation  Date/Time:  Wednesday Nov 14 2022 04:01:12 EDT Ventricular Rate:  103 PR Interval:  122 QRS Duration: 87 QT Interval:  337 QTC Calculation: 442 R Axis:   69 Text Interpretation: Sinus tachycardia No previous ECGs available Confirmed by Paula Libra ([id number]) on 11/14/2022 4:04:49 AM  Radiology No results found.  Procedures Procedures    Medications Ordered in ED Medications  capsaicin (ZOSTRIX) 0.025 % cream ( Topical Given 11/14/22 0414)  ondansetron (ZOFRAN) injection 4 mg (4 mg Intravenous Given 11/14/22 0423)  ketorolac (TORADOL) 15 MG/ML injection 15 mg (15 mg Intravenous Given 11/14/22 0453)    ED Course/ Medical Decision Making/ A&P                             Medical Decision Making Amount and/or Complexity of Data Reviewed Labs: ordered. Radiology: ordered.  Risk OTC drugs. Prescription drug management.    This patient presents to the ED for concern of abdominal pain and vomiting, this involves an extensive number of treatment options, and is a complaint that carries with it a high risk of complications and morbidity.  The differential diagnosis includes gastroenteritis, colitis, small bowel obstruction, appendicitis, cholecystitis, pancreatitis, nephrolithiasis, UTI, pyleonephritis   Co morbidities that complicate the patient evaluation  none   Lab Tests:  I Ordered, and personally interpreted labs.  The pertinent results include:   CBC with differential: No concern for anemia or leukocytosis CMP: no concern for electrolyte abnormality; no concern for kidney/liver damage Lipase: within normal limits UA: no concern for infection   Imaging Studies ordered:  I ordered imaging studies including  -CT Abd/Pelvis with contrast: patient left AMA before CT was  obtained I independently visualized and interpreted imaging I agree with the radiologist interpretation   Cardiac Monitoring: / EKG:  The patient was maintained on a cardiac monitor.  I personally viewed and interpreted the cardiac monitored which showed an underlying rhythm of: sinus rhythm with no acute ST changes   Problem List / ED Course / Critical interventions / Medication management  Patient presented for abdominal pain. On exam patient was tender to palpation of all abdominal quadrants and vomiting. No hernia or mass palpated. I ordered the patient the Zofran and phenergan that he requested  for nausea. Trialing topical capsaicin for patient's chronic pain management - which did not ease patient's pain. Step up pain management with Toradol. After education about Toradol from nurse and administration, patient claims that he is actually allergic to Toradol - I did not witness any allergic reactions after patient had dose of Toradol. Patient asking for Dilaudid - I educated patient that narcotics are not a good option for chronic pain, but can provide him with other means of pain management. Patient then stated he was anaphylactic to every type of pain management and anxiety management that I offered him. Stated that only Dilaudid helps. I went to staff patient with Dr. Read Drivers to reassess any type of non-opioid pain medicine that patient isn't allergic to. Patient left AMA before we were able to come up with a better option. Patient left before CT scan was obtained. Vomiting has resolved. Patient left against medical advice. Patient understands that his/her actions will lead to inadequate medical workup, and that he/she is at risk of complications of missed diagnosis, which includes morbidity and mortality. Patient understands that he/she needs to return to the ER immediately if his/her symptoms get worse. I have reviewed the patients home medicines and have made adjustments as needed   Social  Determinants of Health:  none          Final Clinical Impression(s) / ED Diagnoses Final diagnoses:  Generalized abdominal pain    Rx / DC Orders ED Discharge Orders     None         Margarita Rana 11/14/22 2311    Paula Libra, MD 11/16/22 2233

## 2023-05-20 ENCOUNTER — Emergency Department (HOSPITAL_BASED_OUTPATIENT_CLINIC_OR_DEPARTMENT_OTHER)
Admission: EM | Admit: 2023-05-20 | Discharge: 2023-05-20 | Discharge status: Against Medical Advice | Payer: BLUE CROSS/BLUE SHIELD | Attending: Emergency Medicine | Admitting: Emergency Medicine

## 2023-05-20 ENCOUNTER — Encounter (HOSPITAL_BASED_OUTPATIENT_CLINIC_OR_DEPARTMENT_OTHER): Payer: Self-pay

## 2023-05-20 ENCOUNTER — Other Ambulatory Visit: Payer: Self-pay

## 2023-05-20 DIAGNOSIS — R109 Unspecified abdominal pain: Secondary | ICD-10-CM | POA: Diagnosis present

## 2023-05-20 DIAGNOSIS — K6289 Other specified diseases of anus and rectum: Secondary | ICD-10-CM | POA: Insufficient documentation

## 2023-05-20 DIAGNOSIS — R319 Hematuria, unspecified: Secondary | ICD-10-CM | POA: Diagnosis not present

## 2023-05-20 DIAGNOSIS — Z5321 Procedure and treatment not carried out due to patient leaving prior to being seen by health care provider: Secondary | ICD-10-CM | POA: Diagnosis not present

## 2023-05-20 LAB — URINALYSIS, ROUTINE W REFLEX MICROSCOPIC
Bilirubin Urine: NEGATIVE
Glucose, UA: NEGATIVE mg/dL
Ketones, ur: NEGATIVE mg/dL
Leukocytes,Ua: NEGATIVE
Nitrite: NEGATIVE
Protein, ur: NEGATIVE mg/dL
Specific Gravity, Urine: >=1.03 (ref 1.005–1.030)
pH: 6 (ref 5.0–8.0)

## 2023-05-20 LAB — URINALYSIS, MICROSCOPIC (REFLEX)

## 2023-05-20 NOTE — ED Triage Notes (Signed)
Pt endorses pain that began in abdomen 1 week ago and it has now traveled down to rectum and testicles. Pt reports the pain is worse in rectum. Denies N/V/D. Endorses urinary sx and has had hematuria and black tarry stools. No hx of Crohn's, IBS, or other GI issues. Does not drink alcohol. Pt also reports losing about 10-15 lbs in 1.5 weeks.

## 2023-05-20 NOTE — ED Notes (Signed)
Pt had an emergency and needs to run home and will return. Pt informed may not be able to hold current visit

## 2023-06-08 ENCOUNTER — Emergency Department (HOSPITAL_BASED_OUTPATIENT_CLINIC_OR_DEPARTMENT_OTHER)
Admission: EM | Admit: 2023-06-08 | Discharge: 2023-06-08 | Disposition: A | Discharge status: Home / Self Care | Payer: BLUE CROSS/BLUE SHIELD | Attending: Emergency Medicine | Admitting: Emergency Medicine

## 2023-06-08 ENCOUNTER — Emergency Department (HOSPITAL_BASED_OUTPATIENT_CLINIC_OR_DEPARTMENT_OTHER): Payer: BLUE CROSS/BLUE SHIELD

## 2023-06-08 ENCOUNTER — Encounter (HOSPITAL_BASED_OUTPATIENT_CLINIC_OR_DEPARTMENT_OTHER): Payer: Self-pay | Admitting: Emergency Medicine

## 2023-06-08 DIAGNOSIS — R1031 Right lower quadrant pain: Secondary | ICD-10-CM | POA: Diagnosis present

## 2023-06-08 DIAGNOSIS — R1084 Generalized abdominal pain: Secondary | ICD-10-CM | POA: Diagnosis not present

## 2023-06-08 LAB — CBC
HCT: 41.7 % (ref 39.0–52.0)
Hemoglobin: 14 g/dL (ref 13.0–17.0)
MCH: 29.5 pg (ref 26.0–34.0)
MCHC: 33.6 g/dL (ref 30.0–36.0)
MCV: 88 fL (ref 80.0–100.0)
Platelets: 164 10*3/uL (ref 150–400)
RBC: 4.74 MIL/uL (ref 4.22–5.81)
RDW: 13.6 % (ref 11.5–15.5)
WBC: 7.9 10*3/uL (ref 4.0–10.5)
nRBC: 0 % (ref 0.0–0.2)

## 2023-06-08 LAB — COMPREHENSIVE METABOLIC PANEL
ALT: 28 U/L (ref 0–44)
AST: 22 U/L (ref 15–41)
Albumin: 4.1 g/dL (ref 3.5–5.0)
Alkaline Phosphatase: 84 U/L (ref 38–126)
Anion gap: 6 (ref 5–15)
BUN: 10 mg/dL (ref 6–20)
CO2: 31 mmol/L (ref 22–32)
Calcium: 8.9 mg/dL (ref 8.9–10.3)
Chloride: 102 mmol/L (ref 98–111)
Creatinine, Ser: 1.1 mg/dL (ref 0.61–1.24)
GFR, Estimated: >60 mL/min (ref >60)
Glucose, Bld: 94 mg/dL (ref 70–99)
Potassium: 4.1 mmol/L (ref 3.5–5.1)
Sodium: 139 mmol/L (ref 135–145)
Total Bilirubin: 0.6 mg/dL (ref <1.2)
Total Protein: 7.3 g/dL (ref 6.5–8.1)

## 2023-06-08 LAB — URINALYSIS, ROUTINE W REFLEX MICROSCOPIC
Bilirubin Urine: NEGATIVE
Glucose, UA: NEGATIVE mg/dL
Hgb urine dipstick: NEGATIVE
Ketones, ur: NEGATIVE mg/dL
Leukocytes,Ua: NEGATIVE
Nitrite: NEGATIVE
Protein, ur: NEGATIVE mg/dL
Specific Gravity, Urine: 1.015 (ref 1.005–1.030)
pH: 8.5 — ABNORMAL HIGH (ref 5.0–8.0)

## 2023-06-08 LAB — LIPASE, BLOOD: Lipase: 23 U/L (ref 11–51)

## 2023-06-08 LAB — CK: Total CK: 99 U/L (ref 49–397)

## 2023-06-08 MED ORDER — IOHEXOL 300 MG/ML  SOLN
100.0000 mL | Freq: Once | INTRAMUSCULAR | Status: AC | PRN
  Administered 2023-06-08: 100 mL via INTRAVENOUS

## 2023-06-08 MED ORDER — HYDROMORPHONE HCL 1 MG/ML IJ SOLN
1.0000 mg | Freq: Once | INTRAMUSCULAR | Status: AC
  Administered 2023-06-08: 1 mg via INTRAVENOUS
  Filled 2023-06-08: qty 1

## 2023-06-08 MED ORDER — ONDANSETRON HCL 4 MG/2ML IJ SOLN
4.0000 mg | Freq: Once | INTRAMUSCULAR | Status: AC
  Administered 2023-06-08: 4 mg via INTRAVENOUS
  Filled 2023-06-08: qty 2

## 2023-06-08 NOTE — Discharge Instructions (Signed)
It was a pleasure taking care of you here today  I would keep close follow-up with your gastroenterologist  Return for any worsening symptoms

## 2023-06-08 NOTE — ED Triage Notes (Signed)
RLQ abdominal pain. Reports dx with kidney stones ~ 3 weeks ago. States pain has progressed down, but pain remains. Unsure if he passed the stone. Endorses decreases in urine output and dark colored urine.

## 2023-06-08 NOTE — ED Provider Notes (Signed)
[place] EMERGENCY DEPARTMENT AT [facility] Provider Note   CSN: [id number] Arrival date & time: 06/08/23  1947    History  Chief Complaint  Patient presents with   Abdominal Pain    Tom Munoz is a 32 y.o. male history of cyclical vomiting syndrome, chronic pain, cannabinoid hyperemesis, GERD here for evaluation of abdominal pain.  Patient states he has had the pain for one 1 month.  He does have a history of kidney stones.  He was seen by PCP who told him he possibly had kidney stones according to patient.  He had an x-ray of his abdomen on 11/19 which does show a significant pathology.  He states he has been seen and being followed by gastroenterologist for rectal pain that he has had over the last 2 months.  He notices mucus in his stool.  He notices occasional bright red blood per rectum.  He denies any constipation or diarrhea.  He is not currently having any blood in his stool.  States his gastroenterologist is planning a colonoscopy and anal manometry. Pain is to his right lower quadrant. Does not radiate. He has had a prior appendectomy.  He denies any swelling or redness to his scrotum. Pain is chronic, comes in waves of intensity. He has noted some dark urine but she questioned if this was blood in his urine.  He is not currently anticoagulated.  He has had some nausea without vomiting.  No documented fevers.  HPI     Home Medications Prior to Admission medications   Medication Sig Start Date End Date Taking? Authorizing Provider  HYDROcodone-acetaminophen (NORCO/VICODIN) 5-325 MG tablet Take 2 tablets by mouth every 4 (four) hours as needed. 04/14/19   Ronnie Doss A, PA-C  lidocaine (XYLOCAINE) 2 % solution Use as directed 15 mLs in the mouth or throat every 4 (four) hours as needed for mouth pain. 05/09/18   Rhetta Mura, MD  omeprazole (PRILOSEC) 40 MG capsule Take 1 capsule (40 mg total) by mouth daily. 05/09/18   Rhetta Mura, MD  ondansetron (ZOFRAN) 4 MG tablet Take 1 tablet (4 mg total) by mouth every 6 (six) hours. 04/14/19   Ronnie Doss A, PA-C  potassium chloride SA (K-DUR,KLOR-CON) 20 MEQ tablet Take 2 tablets (40 mEq total) by mouth daily. 05/10/18   Rhetta Mura, MD  promethazine (PHENERGAN) 25 MG tablet Take 1 tablet (25 mg total) by mouth every 6 (six) hours as needed for nausea or vomiting. 08/04/18   Elpidio Anis, PA-C  sucralfate (CARAFATE) 1 GM/10ML suspension Take 1 g by mouth 4 (four) times daily -  with meals and at bedtime.    [provider]  sucralfate (CARAFATE) 1 GM/10ML suspension Take 10 mLs (1 g total) by mouth 4 (four) times daily -  with meals and at bedtime. 06/19/18   Elvina Sidle, MD  sucralfate (CARAFATE) 1 GM/10ML suspension Take 10 mLs (1 g total) by mouth 4 (four) times daily -  with meals and at bedtime for 7 days. 05/09/18 05/16/18  Rhetta Mura, MD  traMADol (ULTRAM) 50 MG tablet Take 1 tablet (50 mg total) by mouth every 6 (six) hours as needed. 08/04/18   Elpidio Anis, PA-C      Allergies    Dicyclomine, Haloperidol, Ketamine, Ketorolac tromethamine, Lorazepam, and Metoclopramide    Review of Systems   Review of Systems  Constitutional: Negative.   HENT: Negative.    Respiratory: Negative.    Cardiovascular: Negative.   Gastrointestinal:  Positive for abdominal pain, blood in stool (intermittent x months) and rectal pain. Negative for abdominal distention, constipation, diarrhea, nausea and vomiting (x months).  Genitourinary:  Positive for hematuria (dark urine?). Negative for decreased urine volume, difficulty urinating, dysuria, enuresis, frequency, genital sores, penile discharge, penile pain, penile swelling, scrotal swelling, testicular pain and urgency.  Musculoskeletal: Negative.   Skin: Negative.   Neurological: Negative.   All other systems reviewed and are negative.   Physical Exam Updated Vital Signs BP 115/60 (BP  Location: Left Arm)   Pulse 70   Temp 98.7 F (37.1 C) (Oral)   Resp 17   Ht 5\' 6"  (1.676 m)   Wt 63.5 kg   SpO2 100%   BMI 22.60 kg/m  Physical Exam Vitals and nursing note reviewed.  Constitutional:      General: He is not in acute distress.    Appearance: He is well-developed. He is not ill-appearing or diaphoretic.  HENT:     Head: Atraumatic.  Eyes:     Pupils: Pupils are equal, round, and reactive to light.  Cardiovascular:     Rate and Rhythm: Normal rate and regular rhythm.  Pulmonary:     Effort: Pulmonary effort is normal. No respiratory distress.  Abdominal:     General: There is no distension.     Palpations: Abdomen is soft.     Tenderness: There is generalized abdominal tenderness and tenderness in the right upper quadrant, right lower quadrant, periumbilical area, suprapubic area and left lower quadrant. There is no right CVA tenderness, left CVA tenderness, guarding or rebound. Negative signs include Murphy's sign and McBurney's sign.  Genitourinary:    Comments: declined Musculoskeletal:        General: Normal range of motion.     Cervical back: Normal range of motion and neck supple.  Skin:    General: Skin is warm and dry.  Neurological:     General: No focal deficit present.     Mental Status: He is alert and oriented to person, place, and time.    ED Results / Procedures / Treatments   Labs (all labs ordered are listed, but only abnormal results are displayed) Labs Reviewed  URINALYSIS, ROUTINE W REFLEX MICROSCOPIC - Abnormal; Notable for the following components:      Result Value   pH 8.5 (*)    All other components within normal limits  CBC  COMPREHENSIVE METABOLIC PANEL  LIPASE, BLOOD  CK    EKG None  Radiology CT ABDOMEN PELVIS W CONTRAST  Result Date: 06/08/2023 CLINICAL DATA:  Right lower quadrant pain EXAM: CT ABDOMEN AND PELVIS WITH CONTRAST TECHNIQUE: Multidetector CT imaging of the abdomen and pelvis was performed using the  standard protocol following bolus administration of intravenous contrast. RADIATION DOSE REDUCTION: This exam was performed according to the departmental dose-optimization program which includes automated exposure control, adjustment of the mA and/or kV according to patient size and/or use of iterative reconstruction technique. CONTRAST:  OMNIPAQUE IOHEXOL 300 MG/ML  SOLN COMPARISON:  CT abdomen and pelvis 04/14/2019 FINDINGS: Lower chest: No acute abnormality. Hepatobiliary: No focal liver abnormality is seen. No gallstones, gallbladder wall thickening, or biliary dilatation. Pancreas: Unremarkable. No pancreatic ductal dilatation or surrounding inflammatory changes. Spleen: Normal in size without focal abnormality. Adrenals/Urinary Tract: Adrenal glands are unremarkable. Kidneys are normal, without renal calculi, focal lesion, or hydronephrosis. Bladder is unremarkable. Stomach/Bowel: Stomach is within normal limits. Appendix is not seen. No evidence of bowel wall thickening, distention, or inflammatory changes.  Vascular/Lymphatic: No significant vascular findings are present. No enlarged abdominal or pelvic lymph nodes. Reproductive: Prostate is unremarkable. Other: There is a small amount of free fluid in the pelvis and right paracolic gutter. There is no focal abdominal wall hernia. Musculoskeletal: No acute or significant osseous findings. IMPRESSION: 1. Small amount of free fluid in the pelvis and right paracolic gutter. 2. Appendix is not seen. Electronically Signed   By: Darliss Cheney M.D.   On: 06/08/2023 21:38    Procedures Procedures    Medications Ordered in ED Medications  HYDROmorphone (DILAUDID) injection 1 mg (1 mg Intravenous Given 06/08/23 2050)  ondansetron (ZOFRAN) injection 4 mg (4 mg Intravenous Given 06/08/23 2049)  iohexol (OMNIPAQUE) 300 MG/ML solution 100 mL (100 mLs Intravenous Contrast Given 06/08/23 2111)    ED Course/ Medical Decision Making/ A&P    32 year old  history of chronic pain here for evaluation of right lower quadrant abdominal pain over the last month.  Noted to have dark urine.  No swelling, redness or pain to scrotum.  He is being followed by GI for this.  As well as has had rectal pain for the last few months.  He is supposed to have a colonoscopy and rectal manometry per patient.  He is afebrile, nonseptic, not ill-appearing.  Labs and imaging personally viewed and interpreted:  CBC without leukocytosis 9 metabolic panel without significant abnormality 9 lipase 23 CK 99 UA negative for infection CT on pelvis with fluid in pelvis, appendix not visualized he is s/p appendectomy  Patient reassessed.  We discussed labs and imaging.  His pain is controlled.  He is tolerating p.o. intake.  We discussed labs and imaging.  Will have him follow-up with his gastroenterologist.  Patient is nontoxic, nonseptic appearing, in no apparent distress.  Patient's pain and other symptoms adequately managed in emergency department.  Fluid bolus given.  Labs, imaging and vitals reviewed.  Patient does not meet the SIRS or Sepsis criteria.  On repeat exam patient does not have a surgical abdomin and there are no peritoneal signs.  No indication of appendicitis, bowel obstruction, bowel perforation, cholecystitis, diverticulitis, abscess, GI bleed, stump appendicitis, persistent/intermittent testicular torsion, infected kidney stone.  Patient discharged home with symptomatic treatment and given strict instructions for follow-up with their primary care physician.  I have also discussed reasons to return immediately to the ER.  Patient expresses understanding and agrees with plan.                                 Medical Decision Making Amount and/or Complexity of Data Reviewed External Data Reviewed: labs, radiology and notes. Labs: ordered. Decision-making details documented in ED Course. Radiology: ordered and independent interpretation performed. Decision-making  details documented in ED Course.  Risk OTC drugs. Prescription drug management. Parenteral controlled substances. Decision regarding hospitalization. Diagnosis or treatment significantly limited by social determinants of health.         Final Clinical Impression(s) / ED Diagnoses Final diagnoses:  Right lower quadrant abdominal pain    Rx / DC Orders ED Discharge Orders     None         Deshanti Adcox A, PA-C 06/08/23 2229    Virgina Norfolk, DO 06/08/23 2302

## 2023-09-04 ENCOUNTER — Encounter (HOSPITAL_BASED_OUTPATIENT_CLINIC_OR_DEPARTMENT_OTHER): Payer: Self-pay

## 2023-09-04 ENCOUNTER — Other Ambulatory Visit: Payer: Self-pay

## 2023-09-04 ENCOUNTER — Emergency Department (HOSPITAL_BASED_OUTPATIENT_CLINIC_OR_DEPARTMENT_OTHER)
Admission: EM | Admit: 2023-09-04 | Discharge: 2023-09-05 | Disposition: A | Discharge status: Home / Self Care | Payer: BLUE CROSS/BLUE SHIELD | Attending: Emergency Medicine | Admitting: Emergency Medicine

## 2023-09-04 ENCOUNTER — Emergency Department (HOSPITAL_BASED_OUTPATIENT_CLINIC_OR_DEPARTMENT_OTHER): Payer: BLUE CROSS/BLUE SHIELD

## 2023-09-04 DIAGNOSIS — R112 Nausea with vomiting, unspecified: Secondary | ICD-10-CM | POA: Diagnosis present

## 2023-09-04 DIAGNOSIS — G43A0 Cyclical vomiting, not intractable: Secondary | ICD-10-CM | POA: Diagnosis not present

## 2023-09-04 DIAGNOSIS — R1084 Generalized abdominal pain: Secondary | ICD-10-CM | POA: Diagnosis not present

## 2023-09-04 DIAGNOSIS — R197 Diarrhea, unspecified: Secondary | ICD-10-CM | POA: Insufficient documentation

## 2023-09-04 LAB — CBC WITH DIFFERENTIAL/PLATELET
Abs Immature Granulocytes: 0.04 10*3/uL (ref 0.00–0.07)
Basophils Absolute: 0 10*3/uL (ref 0.0–0.1)
Basophils Relative: 0 %
Eosinophils Absolute: 0 10*3/uL (ref 0.0–0.5)
Eosinophils Relative: 0 %
HCT: 41.2 % (ref 39.0–52.0)
Hemoglobin: 13.9 g/dL (ref 13.0–17.0)
Immature Granulocytes: 0 %
Lymphocytes Relative: 16 %
Lymphs Abs: 1.4 10*3/uL (ref 0.7–4.0)
MCH: 29.4 pg (ref 26.0–34.0)
MCHC: 33.7 g/dL (ref 30.0–36.0)
MCV: 87.1 fL (ref 80.0–100.0)
Monocytes Absolute: 0.4 10*3/uL (ref 0.1–1.0)
Monocytes Relative: 5 %
Neutro Abs: 7.4 10*3/uL (ref 1.7–7.7)
Neutrophils Relative %: 79 %
Platelets: 188 10*3/uL (ref 150–400)
RBC: 4.73 MIL/uL (ref 4.22–5.81)
RDW: 13.1 % (ref 11.5–15.5)
WBC: 9.3 10*3/uL (ref 4.0–10.5)
nRBC: 0 % (ref 0.0–0.2)

## 2023-09-04 LAB — COMPREHENSIVE METABOLIC PANEL
ALT: 19 U/L (ref 0–44)
AST: 22 U/L (ref 15–41)
Albumin: 4.6 g/dL (ref 3.5–5.0)
Alkaline Phosphatase: 84 U/L (ref 38–126)
Anion gap: 12 (ref 5–15)
BUN: 13 mg/dL (ref 6–20)
CO2: 23 mmol/L (ref 22–32)
Calcium: 9 mg/dL (ref 8.9–10.3)
Chloride: 101 mmol/L (ref 98–111)
Creatinine, Ser: 0.98 mg/dL (ref 0.61–1.24)
GFR, Estimated: >60 mL/min (ref >60)
Glucose, Bld: 103 mg/dL — ABNORMAL HIGH (ref 70–99)
Potassium: 3.7 mmol/L (ref 3.5–5.1)
Sodium: 136 mmol/L (ref 135–145)
Total Bilirubin: 1 mg/dL (ref 0.0–1.2)
Total Protein: 7.7 g/dL (ref 6.5–8.1)

## 2023-09-04 LAB — RESP PANEL BY RT-PCR (RSV, FLU A&B, COVID)  RVPGX2
Influenza A by PCR: NEGATIVE
Influenza B by PCR: NEGATIVE
Resp Syncytial Virus by PCR: NEGATIVE
SARS Coronavirus 2 by RT PCR: NEGATIVE

## 2023-09-04 LAB — LIPASE, BLOOD: Lipase: 20 U/L (ref 11–51)

## 2023-09-04 MED ORDER — IOHEXOL 300 MG/ML  SOLN
100.0000 mL | Freq: Once | INTRAMUSCULAR | Status: AC | PRN
  Administered 2023-09-04: 100 mL via INTRAVENOUS

## 2023-09-04 MED ORDER — MORPHINE SULFATE (PF) 4 MG/ML IV SOLN
4.0000 mg | Freq: Once | INTRAVENOUS | Status: AC
  Administered 2023-09-04: 4 mg via INTRAVENOUS
  Filled 2023-09-04: qty 1

## 2023-09-04 MED ORDER — ONDANSETRON HCL 4 MG/2ML IJ SOLN
4.0000 mg | Freq: Once | INTRAMUSCULAR | Status: AC
  Administered 2023-09-04: 4 mg via INTRAVENOUS
  Filled 2023-09-04: qty 2

## 2023-09-04 MED ORDER — SODIUM CHLORIDE 0.9 % IV BOLUS
1000.0000 mL | Freq: Once | INTRAVENOUS | Status: AC
  Administered 2023-09-04: 1000 mL via INTRAVENOUS

## 2023-09-04 NOTE — ED Triage Notes (Signed)
 Pt c/o abdominal pain, vomiting, and diarrhea for 3 days.

## 2023-09-04 NOTE — ED Provider Notes (Signed)
 [place] [facility] Provider Note   CSN: [id number] Arrival date & time: 09/04/23  2203     History  Chief Complaint  Patient presents with   Abdominal Pain   Emesis   Diarrhea    Ames Tyna Jaksch Jayme Cloud is a 33 y.o. male.   Abdominal Pain Associated symptoms: diarrhea and vomiting   Emesis Associated symptoms: abdominal pain and diarrhea   Diarrhea Associated symptoms: abdominal pain and vomiting   This is a 33 year old male with a history of cyclic vomiting syndrome, GERD, chronic abdominal pain presenting for abdominal pain.  He said 3 days of persistent nausea and vomiting, epigastric and generalized abdominal pain.  He had some diarrhea couple days ago of this has resolved.  No blood in the stool or melena.  No chest pain or shortness of breath.  Subjective fevers and chills.  He states he is being worked up with [employer] with gastroenterology.  Denies any sick contacts.     Home Medications Prior to Admission medications   Medication Sig Start Date End Date Taking? Authorizing Provider  HYDROcodone-acetaminophen (NORCO/VICODIN) 5-325 MG tablet Take 2 tablets by mouth every 4 (four) hours as needed. 04/14/19   Ronnie Doss A, PA-C  lidocaine (XYLOCAINE) 2 % solution Use as directed 15 mLs in the mouth or throat every 4 (four) hours as needed for mouth pain. 05/09/18   Rhetta Mura, MD  omeprazole (PRILOSEC) 40 MG capsule Take 1 capsule (40 mg total) by mouth daily. 05/09/18   Rhetta Mura, MD  ondansetron (ZOFRAN) 4 MG tablet Take 1 tablet (4 mg total) by mouth every 6 (six) hours. 04/14/19   Ronnie Doss A, PA-C  potassium chloride SA (K-DUR,KLOR-CON) 20 MEQ tablet Take 2 tablets (40 mEq total) by mouth daily. 05/10/18   Rhetta Mura, MD  promethazine (PHENERGAN) 25 MG tablet Take 1 tablet (25 mg total) by mouth every 6 (six) hours as needed for nausea or vomiting. 08/04/18   Elpidio Anis, PA-C   sucralfate (CARAFATE) 1 GM/10ML suspension Take 1 g by mouth 4 (four) times daily -  with meals and at bedtime.    [provider]  sucralfate (CARAFATE) 1 GM/10ML suspension Take 10 mLs (1 g total) by mouth 4 (four) times daily -  with meals and at bedtime. 06/19/18   Elvina Sidle, MD  sucralfate (CARAFATE) 1 GM/10ML suspension Take 10 mLs (1 g total) by mouth 4 (four) times daily -  with meals and at bedtime for 7 days. 05/09/18 05/16/18  Rhetta Mura, MD  traMADol (ULTRAM) 50 MG tablet Take 1 tablet (50 mg total) by mouth every 6 (six) hours as needed. 08/04/18   Elpidio Anis, PA-C      Allergies    Dicyclomine, Haloperidol, Ketamine, Ketorolac tromethamine, Lorazepam, and Metoclopramide    Review of Systems   Review of Systems  Gastrointestinal:  Positive for abdominal pain, diarrhea and vomiting.  Review of systems completed and notable as per HPI.  ROS otherwise negative.   Physical Exam Updated Vital Signs BP 134/77   Pulse (!) 58   Temp 98.8 F (37.1 C) (Oral)   Resp 20   Ht 5\' 6"  (1.676 m)   Wt 68 kg   SpO2 92%   BMI 24.21 kg/m  Physical Exam Vitals and nursing note reviewed.  Constitutional:      General: He is in acute distress.     Appearance: He is well-developed. He is ill-appearing.  HENT:  Head: Normocephalic and atraumatic.  Eyes:     Conjunctiva/sclera: Conjunctivae normal.  Cardiovascular:     Rate and Rhythm: Normal rate and regular rhythm.     Pulses: Normal pulses.     Heart sounds: Normal heart sounds. No murmur heard. Pulmonary:     Effort: Pulmonary effort is normal. No respiratory distress.     Breath sounds: Normal breath sounds.  Abdominal:     Palpations: Abdomen is soft.     Tenderness: There is generalized abdominal tenderness. There is no right CVA tenderness, left CVA tenderness, guarding or rebound.  Musculoskeletal:        General: No swelling.     Cervical back: Neck supple.     Right lower leg: No edema.      Left lower leg: No edema.  Skin:    General: Skin is warm and dry.     Capillary Refill: Capillary refill takes less than 2 seconds.  Neurological:     Mental Status: He is alert.  Psychiatric:        Mood and Affect: Mood normal.     ED Results / Procedures / Treatments   Labs (all labs ordered are listed, but only abnormal results are displayed) Labs Reviewed  COMPREHENSIVE METABOLIC PANEL - Abnormal; Notable for the following components:      Result Value   Glucose, Bld 103 (*)    All other components within normal limits  RESP PANEL BY RT-PCR (RSV, FLU A&B, COVID)  RVPGX2  LIPASE, BLOOD  CBC WITH DIFFERENTIAL/PLATELET  URINALYSIS, ROUTINE W REFLEX MICROSCOPIC    EKG None  Radiology No results found.  Procedures Procedures    Medications Ordered in ED Medications  sodium chloride 0.9 % bolus 1,000 mL (0 mLs Intravenous Stopped 09/04/23 2258)  ondansetron (ZOFRAN) injection 4 mg (4 mg Intravenous Given 09/04/23 2222)  morphine (PF) 4 MG/ML injection 4 mg (4 mg Intravenous Given 09/04/23 2222)  iohexol (OMNIPAQUE) 300 MG/ML solution 100 mL (100 mLs Intravenous Contrast Given 09/04/23 2301)    ED Course/ Medical Decision Making/ A&P                                 Medical Decision Making Amount and/or Complexity of Data Reviewed Labs: ordered. Radiology: ordered.  Risk Prescription drug management.   Medical Decision Making:   Mckinnley Smithey is a 33 y.o. male who presented to the ED today with severe abdominal pain, vomiting, diarrhea.  Vital signs reviewed.  Exam he is in significant distress.  He is quite tender but not peritonitic.  He has history of chronic abdominal pain and cyclic vomiting syndrome which could be contributing.  However given his significant pain and persistent vomiting for 3 days I think imaging is warranted.   Patient placed on continuous vitals and telemetry monitoring while in ED which was reviewed periodically.   Reviewed and confirmed nursing documentation for past medical history, family history, social history.  Reassessment and Plan:   Patient remained stable.  Symptoms slightly improved.  CT pending.  Handoff given to Dr. Nicanor Alcon will plan to follow-up CT and reassess.   Patient's presentation is most consistent with acute complicated illness / injury requiring diagnostic workup.           Final Clinical Impression(s) / ED Diagnoses Final diagnoses:  None    Rx / DC Orders ED Discharge Orders     None  Laurence Spates, MD 09/04/23 2308

## 2023-09-05 ENCOUNTER — Emergency Department (HOSPITAL_BASED_OUTPATIENT_CLINIC_OR_DEPARTMENT_OTHER)
Admission: EM | Admit: 2023-09-05 | Discharge: 2023-09-05 | Disposition: A | Discharge status: Home / Self Care | Payer: BLUE CROSS/BLUE SHIELD | Source: Home / Self Care | Attending: Emergency Medicine | Admitting: Emergency Medicine

## 2023-09-05 ENCOUNTER — Other Ambulatory Visit: Payer: Self-pay

## 2023-09-05 ENCOUNTER — Encounter (HOSPITAL_BASED_OUTPATIENT_CLINIC_OR_DEPARTMENT_OTHER): Payer: Self-pay

## 2023-09-05 DIAGNOSIS — R1115 Cyclical vomiting syndrome unrelated to migraine: Secondary | ICD-10-CM

## 2023-09-05 DIAGNOSIS — R197 Diarrhea, unspecified: Secondary | ICD-10-CM | POA: Insufficient documentation

## 2023-09-05 DIAGNOSIS — G43A0 Cyclical vomiting, not intractable: Secondary | ICD-10-CM | POA: Insufficient documentation

## 2023-09-05 DIAGNOSIS — R1084 Generalized abdominal pain: Secondary | ICD-10-CM | POA: Insufficient documentation

## 2023-09-05 MED ORDER — HYDROMORPHONE HCL 1 MG/ML IJ SOLN
1.0000 mg | Freq: Once | INTRAMUSCULAR | Status: AC
  Administered 2023-09-05: 1 mg via INTRAVENOUS
  Filled 2023-09-05: qty 1

## 2023-09-05 MED ORDER — SODIUM CHLORIDE 0.9 % IV BOLUS
1000.0000 mL | Freq: Once | INTRAVENOUS | Status: AC
  Administered 2023-09-05: 1000 mL via INTRAVENOUS

## 2023-09-05 MED ORDER — ONDANSETRON HCL 4 MG/2ML IJ SOLN
4.0000 mg | Freq: Once | INTRAMUSCULAR | Status: AC
  Administered 2023-09-05: 4 mg via INTRAVENOUS
  Filled 2023-09-05: qty 2

## 2023-09-05 NOTE — ED Provider Notes (Addendum)
 [place] [facility] Provider Note   CSN: [id number] Arrival date & time: 09/05/23  6962     History  Chief Complaint  Patient presents with   Abdominal Pain    Tom Munoz is a 33 y.o. male.  Patient just discharged around midnight.  Patient's had about 3 to 4-day history of nausea vomiting and diarrhea.  Seen at [facility] urgent care on the 18th they thought this was may be consistent with norovirus.  Patient last evening had a CBC complete metabolic panel lipase respiratory panel everything was negative no signs of dehydration.  Patient received IV fluids.  Patient received morphine and antinausea medicine.  Patient states that this has happened before.  He does have Zofran dissolvable at home but he went home and he was miserable again so had family bring him back in.  Looking at his medications that he is allergic to it is suggestive that he has received meds for cyclical vomiting or gastroparesis in the past.  Patient states that the vomiting has persisted.  No vomiting here.  Says the diarrhea has resolved.  Has diffuse abdominal pain.       Home Medications Prior to Admission medications   Medication Sig Start Date End Date Taking? Authorizing Provider  HYDROcodone-acetaminophen (NORCO/VICODIN) 5-325 MG tablet Take 2 tablets by mouth every 4 (four) hours as needed. 04/14/19   Ronnie Doss A, PA-C  lidocaine (XYLOCAINE) 2 % solution Use as directed 15 mLs in the mouth or throat every 4 (four) hours as needed for mouth pain. 05/09/18   Rhetta Mura, MD  omeprazole (PRILOSEC) 40 MG capsule Take 1 capsule (40 mg total) by mouth daily. 05/09/18   Rhetta Mura, MD  ondansetron (ZOFRAN) 4 MG tablet Take 1 tablet (4 mg total) by mouth every 6 (six) hours. 04/14/19   Ronnie Doss A, PA-C  potassium chloride SA (K-DUR,KLOR-CON) 20 MEQ tablet Take 2 tablets (40 mEq total) by mouth daily. 05/10/18   Rhetta Mura,  MD  promethazine (PHENERGAN) 25 MG tablet Take 1 tablet (25 mg total) by mouth every 6 (six) hours as needed for nausea or vomiting. 08/04/18   Elpidio Anis, PA-C  sucralfate (CARAFATE) 1 GM/10ML suspension Take 1 g by mouth 4 (four) times daily -  with meals and at bedtime.    [provider]  sucralfate (CARAFATE) 1 GM/10ML suspension Take 10 mLs (1 g total) by mouth 4 (four) times daily -  with meals and at bedtime. 06/19/18   Elvina Sidle, MD  sucralfate (CARAFATE) 1 GM/10ML suspension Take 10 mLs (1 g total) by mouth 4 (four) times daily -  with meals and at bedtime for 7 days. 05/09/18 05/16/18  Rhetta Mura, MD  traMADol (ULTRAM) 50 MG tablet Take 1 tablet (50 mg total) by mouth every 6 (six) hours as needed. 08/04/18   Elpidio Anis, PA-C      Allergies    Dicyclomine, Haloperidol, Ketamine, Ketorolac tromethamine, Lorazepam, and Metoclopramide    Review of Systems   Review of Systems  Constitutional:  Negative for chills and fever.  HENT:  Negative for ear pain and sore throat.   Eyes:  Negative for pain and visual disturbance.  Respiratory:  Negative for cough and shortness of breath.   Cardiovascular:  Negative for chest pain and palpitations.  Gastrointestinal:  Positive for abdominal pain, nausea and vomiting.  Genitourinary:  Negative for dysuria and hematuria.  Musculoskeletal:  Negative for arthralgias and back pain.  Skin:  Negative for color change and rash.  Neurological:  Negative for seizures and syncope.  All other systems reviewed and are negative.   Physical Exam Updated Vital Signs BP (!) 154/88 (BP Location: Right Arm)   Pulse 78   Temp 98.9 F (37.2 C) (Oral)   Resp (!) 22   Ht 1.676 m (5\' 6" )   Wt 68 kg   SpO2 99%   BMI 24.21 kg/m  Physical Exam Vitals and nursing note reviewed.  Constitutional:      General: He is not in acute distress.    Appearance: Normal appearance. He is well-developed.  HENT:     Head: Normocephalic  and atraumatic.     Mouth/Throat:     Mouth: Mucous membranes are dry.  Eyes:     Conjunctiva/sclera: Conjunctivae normal.  Cardiovascular:     Rate and Rhythm: Normal rate and regular rhythm.     Heart sounds: No murmur heard. Pulmonary:     Effort: Pulmonary effort is normal. No respiratory distress.     Breath sounds: Normal breath sounds.  Abdominal:     Palpations: Abdomen is soft.     Tenderness: There is no abdominal tenderness.  Musculoskeletal:        General: No swelling.     Cervical back: Neck supple.  Skin:    General: Skin is warm and dry.     Capillary Refill: Capillary refill takes less than 2 seconds.  Neurological:     General: No focal deficit present.     Mental Status: He is alert and oriented to person, place, and time.  Psychiatric:        Mood and Affect: Mood normal.     ED Results / Procedures / Treatments   Labs (all labs ordered are listed, but only abnormal results are displayed) Labs Reviewed - No data to display  EKG None  Radiology CT ABDOMEN PELVIS W CONTRAST Result Date: 09/04/2023 CLINICAL DATA:  Abdominal pain, acute, nonlocalized , vomiting, and diarrhea x 3 days. Hx acid reflux, cannabinoid hyperemesis syndrome, chronic pain syndrome, gastroparesis, GERD, hiatal hernia, peptic ulcer disease, and appendectomy. EXAM: CT ABDOMEN AND PELVIS WITH CONTRAST TECHNIQUE: Multidetector CT imaging of the abdomen and pelvis was performed using the standard protocol following bolus administration of intravenous contrast. RADIATION DOSE REDUCTION: This exam was performed according to the departmental dose-optimization program which includes automated exposure control, adjustment of the mA and/or kV according to patient size and/or use of iterative reconstruction technique. CONTRAST:  OMNIPAQUE IOHEXOL 300 MG/ML  SOLN COMPARISON:  CT abdomen pelvis 06/08/2023, CT renal 03/2919 FINDINGS: Lower chest: No acute abnormality. Hepatobiliary: No focal liver  abnormality. No gallstones, gallbladder wall thickening, or pericholecystic fluid. No biliary dilatation. Pancreas: No focal lesion. Normal pancreatic contour. No surrounding inflammatory changes. No main pancreatic ductal dilatation. Spleen: Normal in size without focal abnormality. Adrenals/Urinary Tract: No adrenal nodule bilaterally. Bilateral kidneys enhance symmetrically. No hydronephrosis. No hydroureter. The urinary bladder is unremarkable. Stomach/Bowel: Stomach is within normal limits. No evidence of bowel wall thickening or dilatation. Status post appendectomy. Vascular/Lymphatic: No abdominal aorta or iliac aneurysm. No abdominal, pelvic, or inguinal lymphadenopathy. Reproductive: Prostate is unremarkable. Other: No intraperitoneal free fluid. No intraperitoneal free gas. No organized fluid collection. Musculoskeletal: No abdominal wall hernia or abnormality. No suspicious lytic or blastic osseous lesions. No acute displaced fracture. Multilevel degenerative changes of the spine. IMPRESSION: Colonic diverticulosis with no acute diverticulitis. Electronically Signed   By: Normajean Glasgow.D.  On: 09/04/2023 23:16    Procedures Procedures    Medications Ordered in ED Medications  sodium chloride 0.9 % bolus 1,000 mL (has no administration in time range)  ondansetron (ZOFRAN) injection 4 mg (has no administration in time range)  HYDROmorphone (DILAUDID) injection 1 mg (has no administration in time range)    ED Course/ Medical Decision Making/ A&P                                 Medical Decision Making Risk Prescription drug management.  I reviewed patient's labs and CT scan from last night.  No acute findings.  Based on some of his medication allergies patient may have a history of cyclical vomiting.  He denies any THC use.  Will give a liter of fluid IV Zofran IV Dilaudid.  Chart review does show a history of cyclical vomiting.  And also chronic pain and perhaps opioid  seeking.  Patient improved with the IV fluids and the pain medicine.  No further vomiting.  Does seem to be consistent with cyclical vomiting.  Also I think there may be chronic pain issue as well.  Patient stable for discharge home  Final Clinical Impression(s) / ED Diagnoses Final diagnoses:  Cyclical vomiting  Generalized abdominal pain    Rx / DC Orders ED Discharge Orders     None         Vanetta Mulders, MD 09/05/23 1610    Vanetta Mulders, MD 09/05/23 9604    Vanetta Mulders, MD 09/05/23 [phone]

## 2023-09-05 NOTE — Discharge Instructions (Signed)
 The Zofran ODT as directed.  Rest today.  Return for any new or worse symptoms.  This seems to be consistent with cyclical vomiting.

## 2023-09-05 NOTE — ED Triage Notes (Signed)
 Patient here POV from Home.  Endorses ABD Pain, N/V/D that began a few days ago. Seen and treated in ED last PM. Notes Body Aches as well.   Tearful during triage. A&Ox4. GCS 15. BIB Wheelchair.

## 2023-09-07 ENCOUNTER — Other Ambulatory Visit: Payer: Self-pay

## 2023-09-07 ENCOUNTER — Emergency Department (HOSPITAL_BASED_OUTPATIENT_CLINIC_OR_DEPARTMENT_OTHER)
Admission: EM | Admit: 2023-09-07 | Discharge: 2023-09-07 | Discharge status: Against Medical Advice | Payer: BLUE CROSS/BLUE SHIELD | Attending: Emergency Medicine | Admitting: Emergency Medicine

## 2023-09-07 ENCOUNTER — Encounter (HOSPITAL_BASED_OUTPATIENT_CLINIC_OR_DEPARTMENT_OTHER): Payer: Self-pay | Admitting: *Deleted

## 2023-09-07 DIAGNOSIS — D72829 Elevated white blood cell count, unspecified: Secondary | ICD-10-CM | POA: Insufficient documentation

## 2023-09-07 DIAGNOSIS — R112 Nausea with vomiting, unspecified: Secondary | ICD-10-CM | POA: Insufficient documentation

## 2023-09-07 DIAGNOSIS — R1084 Generalized abdominal pain: Secondary | ICD-10-CM | POA: Diagnosis not present

## 2023-09-07 LAB — COMPREHENSIVE METABOLIC PANEL
ALT: 20 U/L (ref 0–44)
AST: 21 U/L (ref 15–41)
Albumin: 4.9 g/dL (ref 3.5–5.0)
Alkaline Phosphatase: 88 U/L (ref 38–126)
Anion gap: 12 (ref 5–15)
BUN: 8 mg/dL (ref 6–20)
CO2: 25 mmol/L (ref 22–32)
Calcium: 9.1 mg/dL (ref 8.9–10.3)
Chloride: 100 mmol/L (ref 98–111)
Creatinine, Ser: 0.88 mg/dL (ref 0.61–1.24)
GFR, Estimated: >60 mL/min (ref >60)
Glucose, Bld: 112 mg/dL — ABNORMAL HIGH (ref 70–99)
Potassium: 3.2 mmol/L — ABNORMAL LOW (ref 3.5–5.1)
Sodium: 137 mmol/L (ref 135–145)
Total Bilirubin: 1.2 mg/dL (ref 0.0–1.2)
Total Protein: 8.3 g/dL — ABNORMAL HIGH (ref 6.5–8.1)

## 2023-09-07 LAB — CBC
HCT: 43.8 % (ref 39.0–52.0)
Hemoglobin: 14.9 g/dL (ref 13.0–17.0)
MCH: 29.7 pg (ref 26.0–34.0)
MCHC: 34 g/dL (ref 30.0–36.0)
MCV: 87.4 fL (ref 80.0–100.0)
Platelets: 203 10*3/uL (ref 150–400)
RBC: 5.01 MIL/uL (ref 4.22–5.81)
RDW: 13.1 % (ref 11.5–15.5)
WBC: 12.5 10*3/uL — ABNORMAL HIGH (ref 4.0–10.5)
nRBC: 0 % (ref 0.0–0.2)

## 2023-09-07 LAB — LIPASE, BLOOD: Lipase: 22 U/L (ref 11–51)

## 2023-09-07 MED ORDER — SODIUM CHLORIDE 0.9 % IV BOLUS: 1000.0000 mL | Freq: Once | INTRAVENOUS | Status: DC

## 2023-09-07 MED ORDER — SODIUM CHLORIDE 0.9 % IV SOLN
12.5000 mg | Freq: Once | INTRAVENOUS | Status: DC
  Filled 2023-09-07: qty 0.5

## 2023-09-07 MED ORDER — PROMETHAZINE HCL 25 MG/ML IJ SOLN
INTRAMUSCULAR | Status: AC
  Filled 2023-09-07: qty 1

## 2023-09-07 NOTE — ED Triage Notes (Signed)
 Pt with hx of cyclic vomiting syndrome since he was 33 years old is here for nausea and vomiting.  Pt was last seen here on 2/20 for N/V/D and was able to be d/c from ED.  Pt states that he continues to have n/v/d.

## 2023-09-07 NOTE — ED Notes (Signed)
 Patient eloped with IV in his arm.

## 2023-09-07 NOTE — ED Notes (Signed)
 RN went to give the patient his medications and he wasn't in the room. Checked the department and bathrooms but no patient.

## 2023-09-07 NOTE — ED Provider Notes (Signed)
 [place] [facility] Provider Note   CSN: [id number] Arrival date & time: 09/07/23  2222     History  Chief Complaint  Patient presents with   Emesis    Tom Munoz is a 33 y.o. male.  Patient is a 33 year old male with history of cyclic vomiting since the age of 76.  Patient presenting today complaining of abdominal cramping and vomiting.  This is his fourth visit to the ER in the past 36 hours.  On previous occasions he has been given medications which helped for a short period of time, then his symptoms returned.  He describes a generalized abdominal discomfort along with vomiting.  No fevers or chills.  The history is provided by the patient.       Home Medications Prior to Admission medications   Medication Sig Start Date End Date Taking? Authorizing Provider  HYDROcodone-acetaminophen (NORCO/VICODIN) 5-325 MG tablet Take 2 tablets by mouth every 4 (four) hours as needed. 04/14/19   Ronnie Doss A, PA-C  lidocaine (XYLOCAINE) 2 % solution Use as directed 15 mLs in the mouth or throat every 4 (four) hours as needed for mouth pain. 05/09/18   Rhetta Mura, MD  omeprazole (PRILOSEC) 40 MG capsule Take 1 capsule (40 mg total) by mouth daily. 05/09/18   Rhetta Mura, MD  ondansetron (ZOFRAN) 4 MG tablet Take 1 tablet (4 mg total) by mouth every 6 (six) hours. 04/14/19   Ronnie Doss A, PA-C  potassium chloride SA (K-DUR,KLOR-CON) 20 MEQ tablet Take 2 tablets (40 mEq total) by mouth daily. 05/10/18   Rhetta Mura, MD  promethazine (PHENERGAN) 25 MG tablet Take 1 tablet (25 mg total) by mouth every 6 (six) hours as needed for nausea or vomiting. 08/04/18   Elpidio Anis, PA-C  sucralfate (CARAFATE) 1 GM/10ML suspension Take 1 g by mouth 4 (four) times daily -  with meals and at bedtime.    [provider]  sucralfate (CARAFATE) 1 GM/10ML suspension Take 10 mLs (1 g total) by mouth 4 (four) times  daily -  with meals and at bedtime. 06/19/18   Elvina Sidle, MD  sucralfate (CARAFATE) 1 GM/10ML suspension Take 10 mLs (1 g total) by mouth 4 (four) times daily -  with meals and at bedtime for 7 days. 05/09/18 05/16/18  Rhetta Mura, MD  traMADol (ULTRAM) 50 MG tablet Take 1 tablet (50 mg total) by mouth every 6 (six) hours as needed. 08/04/18   Elpidio Anis, PA-C      Allergies    Dicyclomine, Haloperidol, Ketamine, Ketorolac tromethamine, Lorazepam, and Metoclopramide    Review of Systems   Review of Systems  All other systems reviewed and are negative.   Physical Exam Updated Vital Signs BP (!) 171/101 (BP Location: Right Arm)   Pulse 63   Temp 98.6 F (37 C) (Oral)   Resp 12   SpO2 96%  Physical Exam Vitals and nursing note reviewed.  Constitutional:      General: He is not in acute distress.    Appearance: He is well-developed. He is not diaphoretic.  HENT:     Head: Normocephalic and atraumatic.  Cardiovascular:     Rate and Rhythm: Normal rate and regular rhythm.     Heart sounds: No murmur heard.    No friction rub.  Pulmonary:     Effort: Pulmonary effort is normal. No respiratory distress.     Breath sounds: Normal breath sounds. No wheezing or rales.  Abdominal:  General: Bowel sounds are normal. There is no distension.     Palpations: Abdomen is soft.     Tenderness: There is no abdominal tenderness.  Musculoskeletal:        General: Normal range of motion.     Cervical back: Normal range of motion and neck supple.  Skin:    General: Skin is warm and dry.  Neurological:     Mental Status: He is alert and oriented to person, place, and time.     Coordination: Coordination normal.     ED Results / Procedures / Treatments   Labs (all labs ordered are listed, but only abnormal results are displayed) Labs Reviewed  COMPREHENSIVE METABOLIC PANEL - Abnormal; Notable for the following components:      Result Value   Potassium 3.2 (*)     Glucose, Bld 112 (*)    Total Protein 8.3 (*)    All other components within normal limits  CBC - Abnormal; Notable for the following components:   WBC 12.5 (*)    All other components within normal limits  LIPASE, BLOOD  URINALYSIS, ROUTINE W REFLEX MICROSCOPIC    EKG None  Radiology No results found.  Procedures Procedures  {Document cardiac monitor, telemetry assessment procedure when appropriate:1}  Medications Ordered in ED Medications  sodium chloride 0.9 % bolus 1,000 mL (has no administration in time range)  promethazine (PHENERGAN) 12.5 mg in sodium chloride 0.9 % 50 mL IVPB (has no administration in time range)    ED Course/ Medical Decision Making/ A&P   {   Click here for ABCD2, HEART and other calculatorsREFRESH Note before signing :1}                              Medical Decision Making Amount and/or Complexity of Data Reviewed Labs: ordered.   ***  {Document critical care time when appropriate:1} {Document review of labs and clinical decision tools ie heart score, Chads2Vasc2 etc:1}  {Document your independent review of radiology images, and any outside records:1} {Document your discussion with family members, caretakers, and with consultants:1} {Document social determinants of health affecting pt's care:1} {Document your decision making why or why not admission, treatments were needed:1} Final Clinical Impression(s) / ED Diagnoses Final diagnoses:  None    Rx / DC Orders ED Discharge Orders     None

## 2024-02-11 ENCOUNTER — Other Ambulatory Visit: Payer: Self-pay

## 2024-02-11 ENCOUNTER — Emergency Department (HOSPITAL_BASED_OUTPATIENT_CLINIC_OR_DEPARTMENT_OTHER)
Admission: EM | Admit: 2024-02-11 | Discharge: 2024-02-12 | Disposition: A | Discharge status: Home / Self Care | Attending: Emergency Medicine | Admitting: Emergency Medicine

## 2024-02-11 ENCOUNTER — Encounter (HOSPITAL_BASED_OUTPATIENT_CLINIC_OR_DEPARTMENT_OTHER): Payer: Self-pay

## 2024-02-11 DIAGNOSIS — R1084 Generalized abdominal pain: Secondary | ICD-10-CM | POA: Insufficient documentation

## 2024-02-11 DIAGNOSIS — R1011 Right upper quadrant pain: Secondary | ICD-10-CM | POA: Diagnosis present

## 2024-02-11 LAB — COMPREHENSIVE METABOLIC PANEL WITH GFR
ALT: 35 U/L (ref 0–44)
AST: 31 U/L (ref 15–41)
Albumin: 4.7 g/dL (ref 3.5–5.0)
Alkaline Phosphatase: 116 U/L (ref 38–126)
Anion gap: 10 (ref 5–15)
BUN: 13 mg/dL (ref 6–20)
CO2: 28 mmol/L (ref 22–32)
Calcium: 9.1 mg/dL (ref 8.9–10.3)
Chloride: 102 mmol/L (ref 98–111)
Creatinine, Ser: 1.09 mg/dL (ref 0.61–1.24)
GFR, Estimated: >60 mL/min (ref >60)
Glucose, Bld: 72 mg/dL (ref 70–99)
Potassium: 4.1 mmol/L (ref 3.5–5.1)
Sodium: 140 mmol/L (ref 135–145)
Total Bilirubin: 0.3 mg/dL (ref 0.0–1.2)
Total Protein: 7.4 g/dL (ref 6.5–8.1)

## 2024-02-11 LAB — CBC
HCT: 41.4 % (ref 39.0–52.0)
Hemoglobin: 13.8 g/dL (ref 13.0–17.0)
MCH: 30.3 pg (ref 26.0–34.0)
MCHC: 33.3 g/dL (ref 30.0–36.0)
MCV: 90.8 fL (ref 80.0–100.0)
Platelets: 201 K/uL (ref 150–400)
RBC: 4.56 MIL/uL (ref 4.22–5.81)
RDW: 13.6 % (ref 11.5–15.5)
WBC: 7.2 K/uL (ref 4.0–10.5)
nRBC: 0 % (ref 0.0–0.2)

## 2024-02-11 LAB — URINALYSIS, ROUTINE W REFLEX MICROSCOPIC
Bilirubin Urine: NEGATIVE
Glucose, UA: NEGATIVE mg/dL
Hgb urine dipstick: NEGATIVE
Ketones, ur: NEGATIVE mg/dL
Leukocytes,Ua: NEGATIVE
Nitrite: NEGATIVE
Protein, ur: NEGATIVE mg/dL
Specific Gravity, Urine: 1.02 (ref 1.005–1.030)
pH: 7 (ref 5.0–8.0)

## 2024-02-11 LAB — LIPASE, BLOOD: Lipase: 19 U/L (ref 11–51)

## 2024-02-11 NOTE — ED Provider Notes (Signed)
 [facility] Provider Note   CSN: [id number] Arrival date & time: 02/11/24  2122     Patient presents with: Abdominal Pain   Tom Munoz is a 33 y.o. male.   Patient to ED for evaluation of abdominal pain. He reports he has pain chronically. Tonight, the pain is in the RUQ. He has known gall stones from previous US  and CT reports. No fever with current symptoms that started about 2 weeks ago. No post-prandial pain. No nausea or vomiting.   The history is provided by the patient. No language interpreter was used.  Abdominal Pain      Prior to Admission medications   Medication Sig Start Date End Date Taking? Authorizing Provider  HYDROcodone -acetaminophen  (NORCO/VICODIN) 5-325 MG tablet Take 2 tablets by mouth every 4 (four) hours as needed. 04/14/19   Rolan Burnard LABOR, PA-C  lidocaine  (XYLOCAINE ) 2 % solution Use as directed 15 mLs in the mouth or throat every 4 (four) hours as needed for mouth pain. 05/09/18   Samtani, Jai-Gurmukh, MD  omeprazole  (PRILOSEC) 40 MG capsule Take 1 capsule (40 mg total) by mouth daily. 05/09/18   Samtani, Jai-Gurmukh, MD  ondansetron  (ZOFRAN ) 4 MG tablet Take 1 tablet (4 mg total) by mouth every 6 (six) hours. 04/14/19   Rolan Burnard LABOR, PA-C  potassium chloride  SA (K-DUR,KLOR-CON ) 20 MEQ tablet Take 2 tablets (40 mEq total) by mouth daily. 05/10/18   Samtani, Jai-Gurmukh, MD  promethazine  (PHENERGAN ) 25 MG tablet Take 1 tablet (25 mg total) by mouth every 6 (six) hours as needed for nausea or vomiting. 08/04/18   Odell Balls, PA-C  sucralfate  (CARAFATE ) 1 GM/10ML suspension Take 1 g by mouth 4 (four) times daily -  with meals and at bedtime.    [provider]  sucralfate  (CARAFATE ) 1 GM/10ML suspension Take 10 mLs (1 g total) by mouth 4 (four) times daily -  with meals and at bedtime. 06/19/18   Mario Million, MD  sucralfate  (CARAFATE ) 1 GM/10ML suspension Take 10 mLs (1 g total)  by mouth 4 (four) times daily -  with meals and at bedtime for 7 days. 05/09/18 05/16/18  Samtani, Jai-Gurmukh, MD  traMADol  (ULTRAM ) 50 MG tablet Take 1 tablet (50 mg total) by mouth every 6 (six) hours as needed. 08/04/18   Odell Balls, PA-C    Allergies: Dicyclomine, Haloperidol, Ketamine, Ketorolac  tromethamine , Lorazepam , and Metoclopramide     Review of Systems  Gastrointestinal:  Positive for abdominal pain.    Updated Vital Signs BP (!) 119/54 (BP Location: Right Arm)   Pulse 76   Temp 98.5 F (36.9 C) (Oral)   Resp 16   Ht 5' 6 (1.676 m)   Wt 65.8 kg   SpO2 98%   BMI 23.40 kg/m   Physical Exam Constitutional:      Appearance: He is well-developed.  HENT:     Head: Normocephalic.  Cardiovascular:     Rate and Rhythm: Normal rate and regular rhythm.     Heart sounds: No murmur heard. Pulmonary:     Effort: Pulmonary effort is normal.     Breath sounds: Normal breath sounds. No wheezing, rhonchi or rales.  Abdominal:     General: Bowel sounds are normal.     Palpations: Abdomen is soft.     Tenderness: There is abdominal tenderness. There is no guarding or rebound.     Comments: Diffuse abdominal tenderness. Nondistended, soft.   Musculoskeletal:  General: Normal range of motion.     Cervical back: Normal range of motion and neck supple.  Skin:    General: Skin is warm and dry.  Neurological:     General: No focal deficit present.     Mental Status: He is alert and oriented to person, place, and time.     (all labs ordered are listed, but only abnormal results are displayed) Labs Reviewed  LIPASE, BLOOD  COMPREHENSIVE METABOLIC PANEL WITH GFR  CBC  URINALYSIS, ROUTINE W REFLEX MICROSCOPIC    EKG: None  Radiology: No results found.   Procedures   Medications Ordered in the ED - No data to display  Clinical Course as of 02/11/24 2346  Tue Feb 11, 2024  2304 Patient with known gall stones on previous reports, chronic abdominal pain  presents for evaluation of worse pain than usual in the RUQ, concerned for gall bladder. No post-prandial component, no fever, no nausea/vomiting.  [SU]  2314 Bedside US  performed by Dr. Yolande. Some wall thickening noted.  [SU]  2344 Labs unremarkable without any abnormality. Patient appears comfortable. He will attempt US  study here tomorrow, ordered entered. Encouraged to follow up with his GI.  [SU]    Clinical Course User Index [SU] Odell Balls, PA-C                                 Medical Decision Making Amount and/or Complexity of Data Reviewed Labs: ordered. Radiology: ordered.        Final diagnoses:  Right upper quadrant abdominal pain  Generalized abdominal pain    ED Discharge Orders          Ordered    US  Abdomen Limited RUQ/Gall Bladder        02/11/24 2316               Odell Balls, PA-C 02/11/24 2346    Roselyn Carlin NOVAK, MD 02/12/24 [phone]

## 2024-02-11 NOTE — ED Triage Notes (Signed)
 Pt report middle abdominal pain over the last couple days and reports pain has gotten better. Pt has hx of gallstones. Denies emesis.

## 2024-02-11 NOTE — Discharge Instructions (Signed)
 As we discussed, you can return here tomorrow to try to obtained with ultrasound as ordered of the gall bladder area. Ultimately, you should follow up with your GI or primary care provider.   Return to the ED with any fever, severe pain, uncontrolled vomiting.

## 2024-02-12 ENCOUNTER — Ambulatory Visit (HOSPITAL_BASED_OUTPATIENT_CLINIC_OR_DEPARTMENT_OTHER): Attending: Emergency Medicine

## 2024-03-19 ENCOUNTER — Telehealth (HOSPITAL_BASED_OUTPATIENT_CLINIC_OR_DEPARTMENT_OTHER): Payer: Self-pay
